# Patient Record
Sex: Male | Born: 1950 | Race: Black or African American | Hispanic: No | Smoking: Never smoker
Health system: Southern US, Community
[De-identification: ages and names within clinical notes are randomized; demographics above are authoritative.]

## PROBLEM LIST (undated history)

## (undated) DIAGNOSIS — I671 Cerebral aneurysm, nonruptured: Secondary | ICD-10-CM

## (undated) DIAGNOSIS — E119 Type 2 diabetes mellitus without complications: Secondary | ICD-10-CM

## (undated) HISTORY — PX: BRAIN SURGERY: SHX531

---

## 2005-06-21 ENCOUNTER — Emergency Department (HOSPITAL_COMMUNITY): Admission: EM | Admit: 2005-06-21 | Discharge: 2005-06-21 | Payer: Self-pay | Admitting: Emergency Medicine

## 2021-03-20 ENCOUNTER — Emergency Department: Payer: Medicare Other

## 2021-03-20 ENCOUNTER — Other Ambulatory Visit: Payer: Self-pay

## 2021-03-20 ENCOUNTER — Emergency Department
Admission: EM | Admit: 2021-03-20 | Discharge: 2021-03-21 | Disposition: A | Discharge status: Home / Self Care | Payer: Medicare Other | Attending: Emergency Medicine | Admitting: Emergency Medicine

## 2021-03-20 DIAGNOSIS — U071 COVID-19: Secondary | ICD-10-CM | POA: Diagnosis not present

## 2021-03-20 DIAGNOSIS — R410 Disorientation, unspecified: Secondary | ICD-10-CM | POA: Diagnosis not present

## 2021-03-20 DIAGNOSIS — E860 Dehydration: Secondary | ICD-10-CM | POA: Insufficient documentation

## 2021-03-20 DIAGNOSIS — E119 Type 2 diabetes mellitus without complications: Secondary | ICD-10-CM | POA: Insufficient documentation

## 2021-03-20 DIAGNOSIS — M79671 Pain in right foot: Secondary | ICD-10-CM | POA: Insufficient documentation

## 2021-03-20 DIAGNOSIS — M79672 Pain in left foot: Secondary | ICD-10-CM | POA: Insufficient documentation

## 2021-03-20 DIAGNOSIS — R42 Dizziness and giddiness: Secondary | ICD-10-CM | POA: Diagnosis present

## 2021-03-20 HISTORY — DX: Type 2 diabetes mellitus without complications: E11.9

## 2021-03-20 HISTORY — DX: Cerebral aneurysm, nonruptured: I67.1

## 2021-03-20 LAB — BASIC METABOLIC PANEL
Anion gap: 7 (ref 5–15)
BUN: 22 mg/dL (ref 8–23)
CO2: 24 mmol/L (ref 22–32)
Calcium: 8.7 mg/dL — ABNORMAL LOW (ref 8.9–10.3)
Chloride: 105 mmol/L (ref 98–111)
Creatinine, Ser: 1.64 mg/dL — ABNORMAL HIGH (ref 0.61–1.24)
GFR, Estimated: 44 mL/min — ABNORMAL LOW (ref >60)
Glucose, Bld: 157 mg/dL — ABNORMAL HIGH (ref 70–99)
Potassium: 3.7 mmol/L (ref 3.5–5.1)
Sodium: 136 mmol/L (ref 135–145)

## 2021-03-20 LAB — CBC
HCT: 51.4 % (ref 39.0–52.0)
Hemoglobin: 17.5 g/dL — ABNORMAL HIGH (ref 13.0–17.0)
MCH: 29.5 pg (ref 26.0–34.0)
MCHC: 34 g/dL (ref 30.0–36.0)
MCV: 86.7 fL (ref 80.0–100.0)
Platelets: 207 10*3/uL (ref 150–400)
RBC: 5.93 MIL/uL — ABNORMAL HIGH (ref 4.22–5.81)
RDW: 13.2 % (ref 11.5–15.5)
WBC: 5.5 10*3/uL (ref 4.0–10.5)
nRBC: 0 % (ref 0.0–0.2)

## 2021-03-20 LAB — HEPATIC FUNCTION PANEL
ALT: 21 U/L (ref 0–44)
AST: 28 U/L (ref 15–41)
Albumin: 3.6 g/dL (ref 3.5–5.0)
Alkaline Phosphatase: 52 U/L (ref 38–126)
Bilirubin, Direct: 0.2 mg/dL (ref 0.0–0.2)
Indirect Bilirubin: 0.7 mg/dL (ref 0.3–0.9)
Total Bilirubin: 0.9 mg/dL (ref 0.3–1.2)
Total Protein: 6.6 g/dL (ref 6.5–8.1)

## 2021-03-20 LAB — LIPASE, BLOOD: Lipase: 45 U/L (ref 11–51)

## 2021-03-20 LAB — URINALYSIS, COMPLETE (UACMP) WITH MICROSCOPIC
Bacteria, UA: NONE SEEN
Bilirubin Urine: NEGATIVE
Glucose, UA: NEGATIVE mg/dL
Hgb urine dipstick: NEGATIVE
Ketones, ur: 20 mg/dL — AB
Leukocytes,Ua: NEGATIVE
Nitrite: NEGATIVE
Protein, ur: 100 mg/dL — AB
Specific Gravity, Urine: 1.03 (ref 1.005–1.030)
pH: 5 (ref 5.0–8.0)

## 2021-03-20 LAB — RESP PANEL BY RT-PCR (FLU A&B, COVID) ARPGX2
Influenza A by PCR: NEGATIVE
Influenza B by PCR: NEGATIVE
SARS Coronavirus 2 by RT PCR: POSITIVE — AB

## 2021-03-20 MED ORDER — SODIUM CHLORIDE 0.9 % IV BOLUS
1000.0000 mL | Freq: Once | INTRAVENOUS | Status: AC
  Administered 2021-03-20: 1000 mL via INTRAVENOUS

## 2021-03-20 MED ORDER — PAXLOVID 20 X 150 MG & 10 X 100MG PO TBPK: 3.0000 | ORAL_TABLET | Freq: Two times a day (BID) | ORAL | 0 refills | Status: AC

## 2021-03-20 MED ORDER — FAMOTIDINE IN NACL 20-0.9 MG/50ML-% IV SOLN
20.0000 mg | Freq: Once | INTRAVENOUS | Status: AC
  Administered 2021-03-20: 20 mg via INTRAVENOUS
  Filled 2021-03-20: qty 50

## 2021-03-20 MED ORDER — METOCLOPRAMIDE HCL 5 MG/ML IJ SOLN
10.0000 mg | Freq: Once | INTRAMUSCULAR | Status: AC
  Administered 2021-03-20: 10 mg via INTRAVENOUS
  Filled 2021-03-20: qty 2

## 2021-03-20 MED ORDER — ONDANSETRON 4 MG PO TBDP: 4.0000 mg | ORAL_TABLET | Freq: Three times a day (TID) | ORAL | 0 refills | Status: DC | PRN

## 2021-03-20 MED ORDER — ACETAMINOPHEN 325 MG PO TABS: 650.0000 mg | ORAL_TABLET | Freq: Four times a day (QID) | ORAL | 0 refills | Status: DC | PRN

## 2021-03-20 NOTE — ED Notes (Signed)
X-ray at bedside

## 2021-03-20 NOTE — ED Provider Notes (Signed)
[facility] Emergency Department Provider Note  ____________________________________________  Time seen: Approximately 11:50 PM  I have reviewed the triage vital signs and the nursing notes.   HISTORY  Chief Complaint Dizziness    Level 5 Caveat: Portions of the History and Physical including HPI and review of systems are unable to be completely obtained due to patient being a poor historian   HPI Glenn Barnett is a 70 y.o. male with a past history of diabetes who was brought to the ED due to dizziness and confusion.  He complains of chronic bilateral foot pain from diabetic neuropathy.  Reports indigestion with belching and poor oral intake.    Past Medical History:  Diagnosis Date   Brain aneurysm    Diabetes mellitus without complication (HCC)      There are no problems to display for this patient.    History reviewed. No pertinent surgical history.   Prior to Admission medications   Medication Sig Start Date End Date Taking? Authorizing Provider  acetaminophen (TYLENOL) 325 MG tablet Take 2 tablets (650 mg total) by mouth every 6 (six) hours as needed. 03/20/21  Yes Sharman Cheek, MD  Nirmatrelvir & Ritonavir (PAXLOVID) 20 x 150 MG & 10 x 100MG  TBPK Take 3 tablets by mouth in the morning and at bedtime for 5 days. Take according to package instruction 03/20/21 03/25/21 Yes 03/27/21, MD  ondansetron (ZOFRAN ODT) 4 MG disintegrating tablet Take 1 tablet (4 mg total) by mouth every 8 (eight) hours as needed for nausea or vomiting. 03/20/21  Yes 05/20/21, MD     Allergies Patient has no known allergies.   No family history on file.  Social History Social History   Tobacco Use   Smoking status: Never   Smokeless tobacco: Never  Substance Use Topics   Alcohol use: Not Currently   Drug use: Not Currently    Review of Systems Level 5 Caveat: Portions of the History and Physical including HPI and review of systems are  unable to be completely obtained due to patient being a poor historian   Constitutional:   No known fever.  ENT:   No rhinorrhea. Cardiovascular:   No chest pain or syncope. Respiratory:   No dyspnea or cough. Gastrointestinal:   Negative for abdominal pain, vomiting and diarrhea.  Musculoskeletal:   Negative for focal pain or swelling ____________________________________________   PHYSICAL EXAM:  VITAL SIGNS: ED Triage Vitals  Enc Vitals Group     BP 03/20/21 2108 (!) 161/86     Pulse Rate 03/20/21 2108 91     Resp 03/20/21 2108 18     Temp 03/20/21 2108 99.1 F (37.3 C)     Temp Source 03/20/21 2108 Oral     SpO2 03/20/21 2108 98 %     Weight 03/20/21 2109 250 lb (113.4 kg)     Height 03/20/21 2109 6\' 4"  (1.93 m)     Head Circumference --      Peak Flow --      Pain Score 03/20/21 2109 8     Pain Loc --      Pain Edu? --      Excl. in GC? --     Vital signs reviewed, nursing assessments reviewed.   Constitutional:   Alert and oriented to person and place.. Non-toxic appearance. Eyes:   Conjunctivae are normal. EOMI. PERRL. ENT      Head:   Normocephalic and atraumatic.      Nose:  No congestion/rhinnorhea.       Mouth/Throat:   Dry mucous membranes, no pharyngeal erythema. No peritonsillar mass.       Neck:   No meningismus. Full ROM. Hematological/Lymphatic/Immunilogical:   No cervical lymphadenopathy. Cardiovascular:   RRR. Symmetric bilateral radial and DP pulses.  No murmurs. Cap refill less than 2 seconds. Respiratory:   Normal respiratory effort without tachypnea/retractions. Breath sounds are clear and equal bilaterally. No wheezes/rales/rhonchi. Gastrointestinal:   Soft and nontender. Non distended. There is no CVA tenderness.  No rebound, rigidity, or guarding. Genitourinary:   deferred Musculoskeletal:   Normal range of motion in all extremities. No joint effusions.  No lower extremity tenderness.  No edema. Neurologic:   Normal speech and language.   Poor memory recall Motor grossly intact. No acute focal neurologic deficits are appreciated.  Skin:    Skin is warm, dry and intact. No rash noted.  No petechiae, purpura, or bullae.  ____________________________________________    LABS (pertinent positives/negatives) (all labs ordered are listed, but only abnormal results are displayed) Labs Reviewed  RESP PANEL BY RT-PCR (FLU A&B, COVID) ARPGX2 - Abnormal; Notable for the following components:      Result Value   SARS Coronavirus 2 by RT PCR POSITIVE (*)    All other components within normal limits  BASIC METABOLIC PANEL - Abnormal; Notable for the following components:   Glucose, Bld 157 (*)    Creatinine, Ser 1.64 (*)    Calcium 8.7 (*)    GFR, Estimated 44 (*)    All other components within normal limits  CBC - Abnormal; Notable for the following components:   RBC 5.93 (*)    Hemoglobin 17.5 (*)    All other components within normal limits  URINALYSIS, COMPLETE (UACMP) WITH MICROSCOPIC - Abnormal; Notable for the following components:   Color, Urine AMBER (*)    APPearance HAZY (*)    Ketones, ur 20 (*)    Protein, ur 100 (*)    All other components within normal limits  LIPASE, BLOOD  HEPATIC FUNCTION PANEL   ____________________________________________   EKG  Interpreted by me  Date: 03/20/2021  Rate: 89  Rhythm: normal sinus rhythm  QRS Axis: normal  Intervals: normal  ST/T Wave abnormalities: normal  Conduction Disutrbances: none  Narrative Interpretation: unremarkable     ____________________________________________    RADIOLOGY  CT Head Wo Contrast  Result Date: 03/20/2021 CLINICAL DATA:  Mental status changes EXAM: CT HEAD WITHOUT CONTRAST TECHNIQUE: Contiguous axial images were obtained from the base of the skull through the vertex without intravenous contrast. COMPARISON:  None. 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 Mild age related volume loss. No acute intracranial  abnormality. Specifically, no hemorrhage, hydrocephalus, mass lesion, acute infarction, or significant intracranial injury. Vascular: No hyperdense vessel or unexpected calcification. Aneurysm clips in the region of the anterior communicating artery. Skull: Prior right frontotemporal craniotomy. Sinuses/Orbits: No acute findings Other: None 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 in the region of the anterior communicating artery. No acute intracranial abnormality. Electronically Signed   By: Charlett Nose M.D.   On: 03/20/2021 23:27   DG Chest Portable 1 View  Result Date: 03/20/2021 CLINICAL DATA:  Weakness, upper abdominal pain EXAM: PORTABLE CHEST 1 VIEW COMPARISON:  06/21/2005 FINDINGS: The heart size and mediastinal contours are within normal limits. Both lungs are clear. The visualized skeletal structures are unremarkable. IMPRESSION: No active disease. Electronically Signed   By: Charlett Nose M.D.  On: 03/20/2021 22:35    ____________________________________________   PROCEDURES Procedures  ____________________________________________  DIFFERENTIAL DIAGNOSIS   Intracranial hemorrhage, brain tumor, viral illness, electrolyte abnormality, dehydration, pneumonia, UTI, dementia, GERD  CLINICAL IMPRESSION / ASSESSMENT AND PLAN / ED COURSE  Medications ordered in the ED: Medications  sodium chloride 0.9 % bolus 1,000 mL (1,000 mLs Intravenous New Bag/Given 03/20/21 2229)  metoCLOPramide (REGLAN) injection 10 mg (10 mg Intravenous Given 03/20/21 2229)  famotidine (PEPCID) IVPB 20 mg premix (0 mg Intravenous Stopped 03/20/21 2313)    Pertinent labs & imaging results that were available during my care of the patient were reviewed by me and considered in my medical decision making (see chart for details).   Abron Neddo was evaluated in Emergency Department on 03/20/2021 for the symptoms described in the history of present illness. He was evaluated in  the context of the global COVID-19 pandemic, which necessitated consideration that the patient might be at risk for infection with the SARS-CoV-2 virus that causes COVID-19. Institutional protocols and algorithms that pertain to the evaluation of patients at risk for COVID-19 are in a state of rapid change based on information released by regulatory bodies including the CDC and federal and state organizations. These policies and algorithms were followed during the patient's care in the ED.   Patient brought to ED with indigestion and dizziness, appears dehydrated.  Vital signs are unremarkable, not in distress.  Given IV fluids and antacids, feeling okay.  Flu negative, COVID positive.  We will again prescribe Paxlovid.  Stable for discharge in care of daughter.      ____________________________________________   FINAL CLINICAL IMPRESSION(S) / ED DIAGNOSES    Final diagnoses:  Dehydration  COVID-19 virus infection     ED Discharge Orders          Ordered    Nirmatrelvir & Ritonavir (PAXLOVID) 20 x 150 MG & 10 x 100MG  TBPK  2 times daily        03/20/21 2347    acetaminophen (TYLENOL) 325 MG tablet  Every 6 hours PRN        03/20/21 2347    ondansetron (ZOFRAN ODT) 4 MG disintegrating tablet  Every 8 hours PRN        03/20/21 2347            Portions of this note were generated with dragon dictation software. Dictation errors may occur despite best attempts at proofreading.   05/20/21, MD 03/20/21 [phone]

## 2021-03-20 NOTE — Discharge Instructions (Addendum)
Your labs and CT scan are all normal.  Your COVID test is positive which may be causing some of the symptoms, and you appear to be somewhat dehydrated.  Take Tylenol as needed for pain and fever.  Zofran for nausea.  Paxlovid to block the virus in your body to speed the resolution of your symptoms.
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
## 2021-03-20 NOTE — ED Notes (Signed)
Patient transported to CT 

## 2021-03-20 NOTE — ED Notes (Signed)
Patient spitting up and burping frequently. Patient provided with emesis bag.

## 2021-03-20 NOTE — ED Notes (Signed)
ED Provider at bedside. 
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
## 2021-03-21 NOTE — ED Notes (Signed)
Ginger ale and crackers provided, no other needs at this time

## 2021-03-21 NOTE — ED Notes (Signed)
Pt laying on side in bed with easy unlabored respirations

## 2021-03-21 NOTE — ED Notes (Addendum)
Pt updated by provider that he will stay overnight for SW to evaluate in the morning. Pt given hospital bed, blankets, additional pillows, tv remote, lights turned off. declines offer of food or other needs at this time. Family also updated

## 2021-03-21 NOTE — ED Notes (Signed)
Spoke with pts daughter, [place], who requested update on patients status. RN provided that pt would board in ED until SW came in, in AM to contact and assess pts need for placement or other medical needs for safe discharge. Per daughter pt does not have medical POA or legal guardian. Daughter to seek medical history from previous PCP/medical offices with patient history to have faxed to current facility [facility] ED.)

## 2021-03-21 NOTE — ED Notes (Signed)
Pt calm , collective 

## 2021-03-21 NOTE — ED Notes (Signed)
Pt walking in room , calm , collective

## 2021-03-21 NOTE — ED Notes (Addendum)
Discharge instructions reviewed with pt. Pt calm , collective , denied pain or sob. Pt ambulatory upon discharge, Pt discharged with son in law

## 2021-03-21 NOTE — ED Notes (Signed)
Gave MD. Cyril Loosen pt daughter information.

## 2021-03-21 NOTE — TOC Transition Note (Signed)
Transition of Care [facility]) - CM/SW Discharge Note   Patient Details  Name: Glenn Barnett MRN: [id number] Date of Birth: Apr 18, 1951  [facility]) CM/SW Contact:  Marina Goodell Phone Number: [phone] 03/21/2021, 9:09 AM   Clinical Narrative:     Patient will transport to motel.  Patient has safe discharge and insurance.  CSW checked w/ EDP and patient does not have other needs.         Patient Goals and CMS Choice        Discharge Placement                       Discharge Plan and Services                                     Social Determinants of Health (SDOH) Interventions     Readmission Risk Interventions No flowsheet data found.

## 2021-03-21 NOTE — TOC Progression Note (Signed)
Transition of Care [facility]) - Progression Note    Patient Details  Name: Glenn Barnett MRN: [id number] Date of Birth: 1951-03-15  Transition of Care [facility]) CM/SW Contact  Marina Goodell Phone Number: [phone] 03/21/2021, 11:10 AM  Clinical Narrative:     CSW spoke with patient's daughter Glenn Barnett [phone] [facility]) wit update on discharge plan.  Ms. Manson Passey expressed concern about the patient being on his own stating the patient has a hx of dementia.  CSW explained to Ms. Manson Passey there is not a dx of dementia or alzheimer and the patient displays capacity to make his own decisions.  Ms. Manson Passey stated she is unable to have the patient stay with her at home due to his COVID+ diagnosis.  Ms. Manson Passey stated the only option would be to take the patient back to the hotel but she felt the patient was unsafe to         Expected Discharge Plan and Services                                                 Social Determinants of Health (SDOH) Interventions    Readmission Risk Interventions No flowsheet data found.

## 2021-03-21 NOTE — ED Notes (Signed)
Ice water tolerated
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
## 2021-03-21 NOTE — ED Notes (Signed)
Pt reports night light on bed is bothering him 'its like a flashlight,' unable to turn off on bed controls so wrapped in linens and pt appreciative. No other needs at this time

## 2021-03-21 NOTE — ED Notes (Signed)
Attempted to call daughter , Left message

## 2021-04-13 ENCOUNTER — Other Ambulatory Visit: Payer: Self-pay

## 2021-04-13 ENCOUNTER — Ambulatory Visit (HOSPITAL_COMMUNITY)
Admission: EM | Admit: 2021-04-13 | Discharge: 2021-04-13 | Disposition: A | Discharge status: Home / Self Care | Payer: Medicare Other | Attending: Medical Oncology | Admitting: Medical Oncology

## 2021-04-13 ENCOUNTER — Encounter (HOSPITAL_COMMUNITY): Payer: Self-pay | Admitting: *Deleted

## 2021-04-13 DIAGNOSIS — Z91138 Patient's unintentional underdosing of medication regimen for other reason: Secondary | ICD-10-CM | POA: Diagnosis not present

## 2021-04-13 DIAGNOSIS — R42 Dizziness and giddiness: Secondary | ICD-10-CM

## 2021-04-13 LAB — CBG MONITORING, ED: Glucose-Capillary: 147 mg/dL — ABNORMAL HIGH (ref 70–99)

## 2021-04-13 MED ORDER — METFORMIN HCL 500 MG PO TABS: 500.0000 mg | ORAL_TABLET | Freq: Two times a day (BID) | ORAL | 0 refills | Status: DC

## 2021-04-13 MED ORDER — GABAPENTIN 100 MG PO CAPS: 100.0000 mg | ORAL_CAPSULE | Freq: Three times a day (TID) | ORAL | 0 refills | Status: DC

## 2021-04-13 MED ORDER — TAMSULOSIN HCL 0.4 MG PO CAPS: 0.4000 mg | ORAL_CAPSULE | Freq: Every day | ORAL | 0 refills | Status: DC

## 2021-04-13 NOTE — ED Provider Notes (Addendum)
[facility]    CSN: [id number] Arrival date & time: 04/13/21  1315      History   Chief Complaint Chief Complaint  Patient presents with   Dizziness   Fever    nau   Nausea   LT shoulder    HPI Glenn Barnett is a 70 y.o. male.   HPI  Dizziness: Patient is a slightly poor historian.  Patient states that about 2 to 3 days ago he noticed some dizziness primarily when he gets out of bed too quickly.  He has not noticed any other significant positional triggers for the dizziness.  He states that the dizziness feels like the room is spinning slightly and resolves quickly on its own.  He also has noticed some recurrence of his diabetic neuropathy.  Some mild associated nausea without vomiting.  No head injury.  He reports that he also has been more tired than normal with some generalized body aches.  He reports that he has recently been transitioning from [facility] to here and that he has run out of his medications about 2 to 3 days ago right before symptoms started.  He is unsure exactly his doses of medication he was taking but he knew that he was taking metformin 500 mg at least 2 times per day, Flomax and gabapentin.  He thinks that he may have been on 3600 mg of gabapentin per day and thinks that he stopped this medication between 2 days and 4 weeks ago.  He denies any shortness of breath, chest pain, calf pain, visual changes, double vision, headache, cold symptoms, cough.   Past Medical History:  Diagnosis Date   Brain aneurysm    Diabetes mellitus without complication (HCC)     There are no problems to display for this patient.   History reviewed. No pertinent surgical history.     Home Medications    Prior to Admission medications   Medication Sig Start Date End Date Taking? Authorizing Provider  acetaminophen (TYLENOL) 325 MG tablet Take 2 tablets (650 mg total) by mouth every 6 (six) hours as needed. 03/20/21   Sharman Cheek, MD  ondansetron (ZOFRAN  ODT) 4 MG disintegrating tablet Take 1 tablet (4 mg total) by mouth every 8 (eight) hours as needed for nausea or vomiting. 03/20/21   Sharman Cheek, MD    Family History History reviewed. No pertinent family history.  Social History Social History   Tobacco Use   Smoking status: Never   Smokeless tobacco: Never  Substance Use Topics   Alcohol use: Not Currently   Drug use: Not Currently     Allergies   Other   Review of Systems Review of Systems  As stated above in HPI Physical Exam Triage Vital Signs ED Triage Vitals [04/13/21 1329]  Enc Vitals Group     BP (!) 147/81     Pulse Rate 81     Resp 20     Temp 98.7 F (37.1 C)     Temp Source Oral     SpO2 94 %     Weight      Height      Head Circumference      Peak Flow      Pain Score      Pain Loc      Pain Edu?      Excl. in GC?    No data found.  Updated Vital Signs BP (!) 147/81   Pulse 81   Temp 98.7  F (37.1 C) (Oral)   Resp 20   SpO2 94%   Physical Exam Vitals and nursing note reviewed.  Constitutional:      General: He is not in acute distress.    Appearance: Normal appearance. He is not ill-appearing, toxic-appearing or diaphoretic.  HENT:     Head: Normocephalic and atraumatic.     Nose: Nose normal.     Mouth/Throat:     Mouth: Mucous membranes are moist.  Eyes:     Extraocular Movements: Extraocular movements intact.     Pupils: Pupils are equal, round, and reactive to light.     Comments: Mild nystagmus of the left eye to EOM exercises  Cardiovascular:     Rate and Rhythm: Normal rate and regular rhythm.     Pulses: Normal pulses.     Heart sounds: Normal heart sounds.  Pulmonary:     Effort: Pulmonary effort is normal.     Breath sounds: Normal breath sounds.  Musculoskeletal:     Cervical back: Normal range of motion and neck supple. No rigidity or tenderness.  Lymphadenopathy:     Cervical: No cervical adenopathy.  Skin:    General: Skin is warm.     Coloration:  Skin is not jaundiced or pale.     Findings: No erythema.  Neurological:     General: No focal deficit present.     Mental Status: He is alert and oriented to person, place, and time.     Cranial Nerves: No cranial nerve deficit.     Sensory: No sensory deficit.     Motor: No weakness.     Coordination: Coordination normal.     Gait: Gait normal.     Deep Tendon Reflexes: Reflexes normal.  Psychiatric:        Mood and Affect: Mood normal.        Behavior: Behavior normal.        Thought Content: Thought content normal.        Judgment: Judgment normal.     UC Treatments / Results  Labs (all labs ordered are listed, but only abnormal results are displayed) Labs Reviewed  CBG MONITORING, ED - Abnormal; Notable for the following components:      Result Value   Glucose-Capillary 147 (*)    All other components within normal limits    EKG   Radiology No results found.  Procedures Procedures (including critical care time)  Medications Ordered in UC Medications - No data to display  Initial Impression / Assessment and Plan / UC Course  I have reviewed the triage vital signs and the nursing notes.  Pertinent labs & imaging results that were available during my care of the patient were reviewed by me and considered in my medical decision making (see chart for details).     New.  Patient does not appear disoriented or confused.  Neurologically he appears intact and has no significant findings suggestive of a stroke versus complication of his aneurysm.  We discussed that he may be having some positional vertigo however given the significant abrupt stopping his medications he more than likely is having secondary side effects especially to stopping the gabapentin.  He feels more strongly that he stopped this medication a few weeks ago rather than 2 days ago.  For this reason we are going to start him off on a lower dose of gabapentin as we are not confident in the dose he was  taking.  We are also going to restart  him on his metformin and tamsulosin.  He should be feeling better over the next few days.  In the meantime we discussed fall precautions along with red flag signs and symptoms that would warrant going to the emergency room or having additional follow-up with our office.  He will try to get established with a primary care provider.    Final Clinical Impressions(s) / UC Diagnoses   Final diagnoses:  None   Discharge Instructions   None    ED Prescriptions   None    PDMP not reviewed this encounter.   Rushie Chestnut, PA-C 04/13/21 1525    [address], PA-C 04/13/21 1527

## 2021-04-13 NOTE — ED Triage Notes (Signed)
Pt reports he has been traveling  a lot and yesterday he he started to feel dizzy and had a fever with nausea.

## 2021-12-29 ENCOUNTER — Emergency Department
Admission: EM | Admit: 2021-12-29 | Discharge: 2021-12-29 | Disposition: A | Discharge status: Home / Self Care | Payer: Medicare Other | Attending: Emergency Medicine | Admitting: Emergency Medicine

## 2021-12-29 ENCOUNTER — Other Ambulatory Visit: Payer: Self-pay

## 2021-12-29 ENCOUNTER — Emergency Department: Payer: Medicare Other

## 2021-12-29 DIAGNOSIS — Z7984 Long term (current) use of oral hypoglycemic drugs: Secondary | ICD-10-CM | POA: Insufficient documentation

## 2021-12-29 DIAGNOSIS — R42 Dizziness and giddiness: Secondary | ICD-10-CM | POA: Diagnosis present

## 2021-12-29 DIAGNOSIS — E119 Type 2 diabetes mellitus without complications: Secondary | ICD-10-CM | POA: Insufficient documentation

## 2021-12-29 DIAGNOSIS — I10 Essential (primary) hypertension: Secondary | ICD-10-CM | POA: Diagnosis not present

## 2021-12-29 DIAGNOSIS — R2681 Unsteadiness on feet: Secondary | ICD-10-CM | POA: Diagnosis not present

## 2021-12-29 DIAGNOSIS — Z79899 Other long term (current) drug therapy: Secondary | ICD-10-CM | POA: Insufficient documentation

## 2021-12-29 LAB — BASIC METABOLIC PANEL
Anion gap: 10 (ref 5–15)
BUN: 24 mg/dL — ABNORMAL HIGH (ref 8–23)
CO2: 25 mmol/L (ref 22–32)
Calcium: 9 mg/dL (ref 8.9–10.3)
Chloride: 102 mmol/L (ref 98–111)
Creatinine, Ser: 1.95 mg/dL — ABNORMAL HIGH (ref 0.61–1.24)
GFR, Estimated: 36 mL/min — ABNORMAL LOW (ref >60)
Glucose, Bld: 134 mg/dL — ABNORMAL HIGH (ref 70–99)
Potassium: 3.7 mmol/L (ref 3.5–5.1)
Sodium: 137 mmol/L (ref 135–145)

## 2021-12-29 LAB — CBC
HCT: 55.7 % — ABNORMAL HIGH (ref 39.0–52.0)
Hemoglobin: 18 g/dL — ABNORMAL HIGH (ref 13.0–17.0)
MCH: 28.2 pg (ref 26.0–34.0)
MCHC: 32.3 g/dL (ref 30.0–36.0)
MCV: 87.3 fL (ref 80.0–100.0)
Platelets: 349 10*3/uL (ref 150–400)
RBC: 6.38 MIL/uL — ABNORMAL HIGH (ref 4.22–5.81)
RDW: 13.4 % (ref 11.5–15.5)
WBC: 10.2 10*3/uL (ref 4.0–10.5)
nRBC: 0 % (ref 0.0–0.2)

## 2021-12-29 LAB — TROPONIN I (HIGH SENSITIVITY): Troponin I (High Sensitivity): 7 ng/L (ref <18)

## 2021-12-29 MED ORDER — LACTATED RINGERS IV BOLUS
1000.0000 mL | Freq: Once | INTRAVENOUS | Status: AC
  Administered 2021-12-29: 1000 mL via INTRAVENOUS

## 2021-12-29 NOTE — ED Notes (Signed)
First nurse and CN will talk.  ?

## 2021-12-29 NOTE — ED Notes (Signed)
Taxi voucher obtained for discharge. Pt stating has no money to call cab for ride. ?

## 2021-12-29 NOTE — ED Notes (Signed)
Pt to ED for intermittent dizziness since 1 month. Pt was orthostatic in triage.  ?

## 2021-12-29 NOTE — ED Notes (Signed)
Pt was calling out because was cold. Provided blankets and helped pt reposition in bed. Pt complaining that bed not comfortable. ?

## 2021-12-29 NOTE — ED Notes (Signed)
ED signature pad does not work. Pt being discharged to lobby. ?

## 2021-12-29 NOTE — ED Notes (Signed)
No answer times 2 for [facility] and VM full. Tried to call daughter listed in chart. No answer, mailbox full. Called CN, who said we can have courtesy ride bring to bus stop in parking lot.  ?

## 2021-12-29 NOTE — ED Notes (Signed)
Pt on the call bell saying he needs to use the bathroom and saying he needs to get the "Fu** hooked back up" yelling and cursing with a loud voice. ?

## 2021-12-29 NOTE — ED Notes (Signed)
Pt will discharge to lobby. ?

## 2021-12-29 NOTE — ED Triage Notes (Signed)
Pt here with dizziness via [facility]. Pt woke up with the dizziness today, this has happened in the past, cause unknown. Hx of DM and htn. Pt stable on arrival to ED. ? ? ?117/87-lying ?85/52-sitting ?68/45-standing ?127-cbg ?76 ?97% RA ?20G RAC ?

## 2021-12-29 NOTE — Discharge Instructions (Signed)
Your lab work today showed that you are likely dehydrated.  You should drink at least 1 L of water daily to help prevent additional dizzy episodes.  Your blood pressure was also borderline low and for now you should stop your blood pressure medicine.  Please do not take tamsulosin as this could also contribute to your dizziness.  You need to establish care with a local PCP and have been provided a phone number to call to do so.  Please also follow-up with cardiology for Holter monitor placement.  You should return to the ER for any new or worsening symptoms. ?

## 2021-12-29 NOTE — ED Provider Notes (Signed)
? ?[facility] ?Provider Note ? ? ? Event Date/Time  ? First MD Initiated Contact with Patient 12/29/21 1536   ?  (approximate) ? ? ?History  ? ?Chief Complaint ?Dizziness ? ? ?HPI ? ?Glenn Barnett is a 71 y.o. male with past medical history of hypertension and diabetes who presents to the ED for dizziness.  Patient reports that he has been having intermittent episodes of dizziness and lightheadedness for over the past month.  He had return of symptoms around 8 AM this morning when he went to get up out of bed.  He states that the unsteadiness is present at rest but gets worse when he goes to stand up.  He has never passed out and denies any associated chest pain or shortness of breath.  He did go to the walk-in clinic at [place] 9 days ago, noted to be hypertensive at that time and restarted on lisinopril and hydrochlorothiazide as well as metformin.  He has continued to have intermittent episodes of dizziness and lightheadedness since then.  He denies any vision changes, speech changes, numbness, weakness. ? ?  ? ? ?Physical Exam  ? ?Triage Vital Signs: ?ED Triage Vitals  ?Enc Vitals Group  ?   BP 12/29/21 1437 95/62  ?   Pulse Rate 12/29/21 1437 (!) 102  ?   Resp 12/29/21 1437 20  ?   Temp 12/29/21 1437 98.5 ?F (36.9 ?C)  ?   Temp Source 12/29/21 1437 Oral  ?   SpO2 12/29/21 1437 99 %  ?   Weight 12/29/21 1435 250 lb (113.4 kg)  ?   Height 12/29/21 1435 6\' 4"  (1.93 m)  ?   Head Circumference --   ?   Peak Flow --   ?   Pain Score 12/29/21 1435 0  ?   Pain Loc --   ?   Pain Edu? --   ?   Excl. in GC? --   ? ? ?Most recent vital signs: ?Vitals:  ? 12/29/21 1650 12/29/21 1700  ?BP:  103/63  ?Pulse: 92 95  ?Resp: 17 18  ?Temp:    ?SpO2: 100% 100%  ? ? ?Constitutional: Alert and oriented. ?Eyes: Conjunctivae are normal. ?Head: Atraumatic. ?Nose: No congestion/rhinnorhea. ?Mouth/Throat: Mucous membranes are moist.  ?Cardiovascular: Normal rate, regular rhythm. Grossly normal heart sounds.  2+  radial pulses bilaterally. ?Respiratory: Normal respiratory effort.  No retractions. Lungs CTAB. ?Gastrointestinal: Soft and nontender. No distention. ?Musculoskeletal: No lower extremity tenderness nor edema.  ?Neurologic:  Normal speech and language. No gross focal neurologic deficits are appreciated. ? ? ? ?ED Results / Procedures / Treatments  ? ?Labs ?(all labs ordered are listed, but only abnormal results are displayed) ?Labs Reviewed  ?BASIC METABOLIC PANEL - Abnormal; Notable for the following components:  ?    Result Value  ? Glucose, Bld 134 (*)   ? BUN 24 (*)   ? Creatinine, Ser 1.95 (*)   ? GFR, Estimated 36 (*)   ? All other components within normal limits  ?CBC - Abnormal; Notable for the following components:  ? RBC 6.38 (*)   ? Hemoglobin 18.0 (*)   ? HCT 55.7 (*)   ? All other components within normal limits  ?URINALYSIS, ROUTINE W REFLEX MICROSCOPIC  ?CBG MONITORING, ED  ?TROPONIN I (HIGH SENSITIVITY)  ? ? ? ?EKG ? ?ED ECG REPORT ?12/31/21, the attending physician, personally viewed and interpreted this ECG. ? ? Date: 12/29/2021 ? EKG Time: 14:40 ?  Rate: 106 ? Rhythm: sinus tachycardia ? Axis: Normal ? Intervals:none ? ST&T Change: None ? ?RADIOLOGY ?CT head reviewed by me with no hemorrhage or midline shift. ? ?PROCEDURES: ? ?Critical Care performed: No ? ?.1-3 Lead EKG Interpretation ?Performed by: Chesley Noon, MD ?Authorized by: Chesley Noon, MD  ? ?  Interpretation: normal   ?  ECG rate:  80-100 ?  ECG rate assessment: normal   ?  Rhythm: sinus rhythm   ?  Ectopy: none   ?  Conduction: normal   ? ? ?MEDICATIONS ORDERED IN ED: ?Medications  ?lactated ringers bolus 1,000 mL (1,000 mLs Intravenous New Bag/Given 12/29/21 1556)  ? ? ? ?IMPRESSION / MDM / ASSESSMENT AND PLAN / ED COURSE  ?I reviewed the triage vital signs and the nursing notes. ?             ?               ? ?71 y.o. male with past medical history of hypertension and diabetes who presents to the ED for intermittent  episodes of dizziness and lightheadedness, worse when going to stand up. ? ?Differential diagnosis includes, but is not limited to, stroke, electrolyte abnormality, dehydration, orthostatic hypotension, medication effect, arrhythmia, ACS. ? ?Patient nontoxic-appearing and in no acute distress, vital signs reassuring although patient noted to be significantly orthostatic in triage.  EKG shows no evidence of arrhythmia or ischemia and I have low suspicion for cardiac etiology but we will add on troponin.  Labs thus far remarkable for mild AKI along with elevated hemoglobin consistent with hemoconcentration.  We will hydrate with IV fluids and observe on cardiac monitor.  Patient may also benefit from stroke rule out given significant persistent symptoms and CT head is pending at this time. ? ?The patient is on the cardiac monitor to evaluate for evidence of arrhythmia and/or significant heart rate changes. ? ?CT head is negative for acute process, no events noted on cardiac monitor.  Troponin is within normal limits and patient reports feeling slightly better following IV fluid bolus.  He was able to stand and walk with mild ongoing dizziness, blood pressure does remain borderline low and I am concerned the restarting of his blood pressure medicines could be contributing to symptoms.  He was counseled to stop these and drink plenty of fluids, follow-up with cardiology and establish care with PCP.  He was counseled to return to the ED for new worsening symptoms, patient agrees with plan. ? ?  ? ? ?FINAL CLINICAL IMPRESSION(S) / ED DIAGNOSES  ? ?Final diagnoses:  ?Lightheadedness  ? ? ? ?Rx / DC Orders  ? ?ED Discharge Orders   ? ? None  ? ?  ? ? ? ?Note:  This document was prepared using Dragon voice recognition software and may include unintentional dictation errors. ?  ?Chesley Noon, MD ?12/29/21 1802 ? ?

## 2022-04-28 ENCOUNTER — Emergency Department
Admission: EM | Admit: 2022-04-28 | Discharge: 2022-04-28 | Disposition: A | Discharge status: Home / Self Care | Payer: Medicare Other | Attending: Student in an Organized Health Care Education/Training Program | Admitting: Student in an Organized Health Care Education/Training Program

## 2022-04-28 ENCOUNTER — Emergency Department: Payer: Medicare Other

## 2022-04-28 ENCOUNTER — Other Ambulatory Visit: Payer: Self-pay

## 2022-04-28 DIAGNOSIS — W19XXXA Unspecified fall, initial encounter: Secondary | ICD-10-CM

## 2022-04-28 DIAGNOSIS — W06XXXA Fall from bed, initial encounter: Secondary | ICD-10-CM | POA: Diagnosis not present

## 2022-04-28 DIAGNOSIS — Z23 Encounter for immunization: Secondary | ICD-10-CM | POA: Diagnosis not present

## 2022-04-28 DIAGNOSIS — S0990XA Unspecified injury of head, initial encounter: Secondary | ICD-10-CM | POA: Insufficient documentation

## 2022-04-28 DIAGNOSIS — E860 Dehydration: Secondary | ICD-10-CM

## 2022-04-28 DIAGNOSIS — Z79899 Other long term (current) drug therapy: Secondary | ICD-10-CM | POA: Diagnosis not present

## 2022-04-28 LAB — BASIC METABOLIC PANEL
Anion gap: 8 (ref 5–15)
BUN: 21 mg/dL (ref 8–23)
CO2: 24 mmol/L (ref 22–32)
Calcium: 9.4 mg/dL (ref 8.9–10.3)
Chloride: 103 mmol/L (ref 98–111)
Creatinine, Ser: 1.69 mg/dL — ABNORMAL HIGH (ref 0.61–1.24)
GFR, Estimated: 43 mL/min — ABNORMAL LOW (ref >60)
Glucose, Bld: 135 mg/dL — ABNORMAL HIGH (ref 70–99)
Potassium: 3.7 mmol/L (ref 3.5–5.1)
Sodium: 135 mmol/L (ref 135–145)

## 2022-04-28 LAB — CBC
HCT: 51.5 % (ref 39.0–52.0)
Hemoglobin: 16.7 g/dL (ref 13.0–17.0)
MCH: 28.6 pg (ref 26.0–34.0)
MCHC: 32.4 g/dL (ref 30.0–36.0)
MCV: 88.3 fL (ref 80.0–100.0)
Platelets: 341 10*3/uL (ref 150–400)
RBC: 5.83 MIL/uL — ABNORMAL HIGH (ref 4.22–5.81)
RDW: 13.9 % (ref 11.5–15.5)
WBC: 8.9 10*3/uL (ref 4.0–10.5)
nRBC: 0 % (ref 0.0–0.2)

## 2022-04-28 LAB — URINALYSIS, ROUTINE W REFLEX MICROSCOPIC
Bilirubin Urine: NEGATIVE
Glucose, UA: NEGATIVE mg/dL
Hgb urine dipstick: NEGATIVE
Ketones, ur: NEGATIVE mg/dL
Leukocytes,Ua: NEGATIVE
Nitrite: NEGATIVE
Protein, ur: NEGATIVE mg/dL
Specific Gravity, Urine: 1.012 (ref 1.005–1.030)
pH: 5 (ref 5.0–8.0)

## 2022-04-28 LAB — ETHANOL: Alcohol, Ethyl (B): <10 mg/dL (ref <10)

## 2022-04-28 LAB — TROPONIN I (HIGH SENSITIVITY): Troponin I (High Sensitivity): 5 ng/L (ref <18)

## 2022-04-28 MED ORDER — TETANUS-DIPHTH-ACELL PERTUSSIS 5-2.5-18.5 LF-MCG/0.5 IM SUSY
0.5000 mL | PREFILLED_SYRINGE | Freq: Once | INTRAMUSCULAR | Status: AC
Start: 2022-04-28 — End: 2022-04-28
  Administered 2022-04-28: 0.5 mL via INTRAMUSCULAR
  Filled 2022-04-28: qty 0.5

## 2022-04-28 MED ORDER — LIDOCAINE-EPINEPHRINE-TETRACAINE (LET) TOPICAL GEL
3.0000 mL | Freq: Once | TOPICAL | Status: AC
Start: 2022-04-28 — End: 2022-04-28
  Administered 2022-04-28: 3 mL via TOPICAL
  Filled 2022-04-28: qty 3

## 2022-04-28 MED ORDER — SODIUM CHLORIDE 0.9 % IV BOLUS
1000.0000 mL | Freq: Once | INTRAVENOUS | Status: AC
  Administered 2022-04-28: 1000 mL via INTRAVENOUS

## 2022-04-28 NOTE — ED Notes (Addendum)
Patient resting in bed.

## 2022-04-28 NOTE — ED Provider Notes (Signed)
[facility] Provider Note    Event Date/Time   First MD Initiated Contact with Patient 04/28/22 1520     (approximate)   History   Fall   HPI  Glenn Barnett is a 71 y.o. male presents to the ER for evaluation of several weeks of lightheadedness and weakness.  No numbness or tingling.  Had a fall today getting out of bed.  States he is living out of a hotel currently hit the back of his head denies any chest pain or pressure no nausea or vomiting.  Was not working out in the heat today.  Denies any dysuria.  No palpitations no melena no hematochezia.     Physical Exam   Triage Vital Signs: ED Triage Vitals  Enc Vitals Group     BP 04/28/22 1237 92/61     Pulse Rate 04/28/22 1237 92     Resp 04/28/22 1237 18     Temp 04/28/22 1237 97.8 F (36.6 C)     Temp Source 04/28/22 1237 Oral     SpO2 04/28/22 1237 100 %     Weight 04/28/22 1239 255 lb (115.7 kg)     Height 04/28/22 1239 6\' 3"  (1.905 m)     Head Circumference --      Peak Flow --      Pain Score --      Pain Loc --      Pain Edu? --      Excl. in GC? --     Most recent vital signs: Vitals:   04/28/22 1821 04/28/22 1833  BP: (!) 152/79 (!) 128/58  Pulse:  62  Resp:  18  Temp:  98.1 F (36.7 C)  SpO2:  99%     Constitutional: Alert  Eyes: Conjunctivae are normal.  Head: Atraumatic. Nose: No congestion/rhinnorhea. Mouth/Throat: Mucous membranes are moist.   Neck: Painless ROM.  Cardiovascular:   Good peripheral circulation. Respiratory: Normal respiratory effort.  No retractions.  Gastrointestinal: Soft and nontender.  Musculoskeletal:  no deformity Neurologic:  MAE spontaneously. No gross focal neurologic deficits are appreciated.  Skin:  Skin is warm, dry and intact. No rash noted. Psychiatric: Mood and affect are normal. Speech and behavior are normal.    ED Results / Procedures / Treatments   Labs (all labs ordered are listed, but only abnormal results are  displayed) Labs Reviewed  BASIC METABOLIC PANEL - Abnormal; Notable for the following components:      Result Value   Glucose, Bld 135 (*)    Creatinine, Ser 1.69 (*)    GFR, Estimated 43 (*)    All other components within normal limits  CBC - Abnormal; Notable for the following components:   RBC 5.83 (*)    All other components within normal limits  URINALYSIS, ROUTINE W REFLEX MICROSCOPIC - Abnormal; Notable for the following components:   Color, Urine YELLOW (*)    APPearance CLEAR (*)    All other components within normal limits  ETHANOL  CBG MONITORING, ED  TROPONIN I (HIGH SENSITIVITY)     EKG  ED ECG REPORT I, 04/30/22, the attending physician, personally viewed and interpreted this ECG.   Date: 04/28/2022  EKG Time: 12:46  Rate: 95  Rhythm: sinus  Axis: normal  Intervals:normal  ST&T Change: no stemi, no depressions    RADIOLOGY Please see ED Course for my review and interpretation.  I personally reviewed all radiographic images ordered to evaluate for the above acute complaints  and reviewed radiology reports and findings.  These findings were personally discussed with the patient.  Please see medical record for radiology report.    PROCEDURES:  Critical Care performed:   Procedures   MEDICATIONS ORDERED IN ED: Medications  sodium chloride 0.9 % bolus 1,000 mL (0 mLs Intravenous Stopped 04/28/22 1834)  lidocaine-EPINEPHrine-tetracaine (LET) topical gel (3 mLs Topical Given 04/28/22 1622)  Tdap (BOOSTRIX) injection 0.5 mL (0.5 mLs Intramuscular Given 04/28/22 1626)     IMPRESSION / MDM / ASSESSMENT AND PLAN / ED COURSE  I reviewed the triage vital signs and the nursing notes.                              Differential diagnosis includes, but is not limited to, dehydration, electrolyte abnormality, anemia, dysrhythmia, pneumonia, CVA, seizure, SDH, IPH  Patient presented to the ER for evaluation of symptoms as described above.  He is  clinically well-appearing in no acute distress does have small superficial injury to his scalp with some bleeding that is controlled will place let we will update his tetanus.  CT imaging of the head and neck ordered out of triage on my review and interpretation does not show any evidence of fracture IPH or SDH.  Clinically is not consistent with CVA.  Not consistent with seizure.  Patient without syncope or LOC.  Will observe on cardiac monitor given his age and risk factors will check troponin though I doubt cardiac etiology.  Does appear clinically dehydrated therefore will give IV fluids.   Clinical Course as of 04/28/22 1839  Fri Apr 28, 2022  1611 X-ray on my review and interpretation does not show any evidence of infiltrate or consolidation. [PR]  1640 Patient up ambulating to and from the bathroom no acute distress [PR]  1701 Further examination patient's wound of the left scalp is small superficial well approximated laceration does not require staple or stitches.  Hemostatic. [PR]  1736 Patient appears significantly improved after IV fluids.  He is tolerating p.o. urinalysis without infection.  Troponin negative.  No anemia.  Remains well and nontoxic-appearing.  Blood pressure improved after IV fluids here though has been normotensive I do suspect he is a little bit dehydrated clinically.  At this point does appear stable and appropriate for outpatient follow-up. [PR]    Clinical Course User Index [PR] Willy Eddy, MD       FINAL CLINICAL IMPRESSION(S) / ED DIAGNOSES   Final diagnoses:  Fall, initial encounter  Dehydration     Rx / DC Orders   ED Discharge Orders     None        Note:  This document was prepared using Dragon voice recognition software and may include unintentional dictation errors.    Willy Eddy, MD 04/28/22 [phone]

## 2022-04-28 NOTE — ED Notes (Signed)
This RN attempted to get VS on pt, pt. States he needs to have a bowel movement first. Declines VS.

## 2022-04-28 NOTE — ED Triage Notes (Signed)
Pt comes into the ED via EMS from  [facility] lodge with c/o dizziness for several weeks fell this morning and hit his head on the bed rail with lac , controlled bleeding, denies LOC, not on blood thinners, homeless. Pt has unsteady gait. A/ox4   100/60 CBG137 98%RA HR100

## 2022-06-19 ENCOUNTER — Other Ambulatory Visit: Payer: Self-pay

## 2022-06-19 ENCOUNTER — Emergency Department
Admission: EM | Admit: 2022-06-19 | Discharge: 2022-06-19 | Disposition: A | Discharge status: Home / Self Care | Payer: Medicare Other | Attending: Emergency Medicine | Admitting: Emergency Medicine

## 2022-06-19 ENCOUNTER — Encounter: Payer: Self-pay | Admitting: Emergency Medicine

## 2022-06-19 DIAGNOSIS — E860 Dehydration: Secondary | ICD-10-CM | POA: Diagnosis not present

## 2022-06-19 DIAGNOSIS — E119 Type 2 diabetes mellitus without complications: Secondary | ICD-10-CM | POA: Insufficient documentation

## 2022-06-19 DIAGNOSIS — Z20822 Contact with and (suspected) exposure to covid-19: Secondary | ICD-10-CM | POA: Diagnosis not present

## 2022-06-19 DIAGNOSIS — R42 Dizziness and giddiness: Secondary | ICD-10-CM | POA: Diagnosis present

## 2022-06-19 LAB — BASIC METABOLIC PANEL
Anion gap: 10 (ref 5–15)
BUN: 16 mg/dL (ref 8–23)
CO2: 22 mmol/L (ref 22–32)
Calcium: 9 mg/dL (ref 8.9–10.3)
Chloride: 106 mmol/L (ref 98–111)
Creatinine, Ser: 1.26 mg/dL — ABNORMAL HIGH (ref 0.61–1.24)
GFR, Estimated: >60 mL/min (ref >60)
Glucose, Bld: 190 mg/dL — ABNORMAL HIGH (ref 70–99)
Potassium: 3.7 mmol/L (ref 3.5–5.1)
Sodium: 138 mmol/L (ref 135–145)

## 2022-06-19 LAB — CBC
HCT: 52.8 % — ABNORMAL HIGH (ref 39.0–52.0)
Hemoglobin: 17.1 g/dL — ABNORMAL HIGH (ref 13.0–17.0)
MCH: 28.5 pg (ref 26.0–34.0)
MCHC: 32.4 g/dL (ref 30.0–36.0)
MCV: 88 fL (ref 80.0–100.0)
Platelets: 321 10*3/uL (ref 150–400)
RBC: 6 MIL/uL — ABNORMAL HIGH (ref 4.22–5.81)
RDW: 13.7 % (ref 11.5–15.5)
WBC: 10.4 10*3/uL (ref 4.0–10.5)
nRBC: 0 % (ref 0.0–0.2)

## 2022-06-19 LAB — URINALYSIS, ROUTINE W REFLEX MICROSCOPIC
Bilirubin Urine: NEGATIVE
Glucose, UA: NEGATIVE mg/dL
Hgb urine dipstick: NEGATIVE
Ketones, ur: NEGATIVE mg/dL
Leukocytes,Ua: NEGATIVE
Nitrite: NEGATIVE
Protein, ur: NEGATIVE mg/dL
Specific Gravity, Urine: 1.016 (ref 1.005–1.030)
pH: 5 (ref 5.0–8.0)

## 2022-06-19 LAB — SARS CORONAVIRUS 2 BY RT PCR: SARS Coronavirus 2 by RT PCR: NEGATIVE

## 2022-06-19 MED ORDER — SODIUM CHLORIDE 0.9 % IV BOLUS
1000.0000 mL | Freq: Once | INTRAVENOUS | Status: AC
  Administered 2022-06-19: 1000 mL via INTRAVENOUS

## 2022-06-19 MED ORDER — METFORMIN HCL 500 MG PO TABS: 500.0000 mg | ORAL_TABLET | Freq: Two times a day (BID) | ORAL | 2 refills | Status: DC

## 2022-06-19 NOTE — ED Triage Notes (Signed)
Pt via EMS from homeless shelter. States has been dizzy this morning. States he did have a fall a week ago from the dizziness, denies head injury. Denies any pain at this time. Pt has been out of his metformin for a few days and was hoping for a refill. Pt is A&Ox4 and NAD.

## 2022-06-19 NOTE — Discharge Instructions (Signed)
Your lab tests and covid test were all okay today.

## 2022-06-19 NOTE — ED Provider Notes (Signed)
Procedures     ----------------------------------------- 12:50 PM on 06/19/2022 ----------------------------------------- Patient seen by social work, given shelter resources.     Sharman Cheek, MD 06/19/22 1250

## 2022-06-19 NOTE — ED Triage Notes (Signed)
Pt via EMS from homeless shelter, pt c/o dizziness for the last 2 hours. States he been out of his metformin for the past 2 days. Pt requesting the medication refill. No recent falls.  Pt is A&Ox4 and NAD  175 CBG 105/78 113 HR  98% on RA

## 2022-06-19 NOTE — TOC Initial Note (Signed)
Transition of Care [facility]) - Initial/Assessment Note    Patient Details  Name: Glenn Barnett MRN: [id number] Date of Birth: 1951-03-21  Transition of Care [facility]) CM/SW Contact:    Shelbie Hutching, RN Phone Number: 06/19/2022, 1:34 PM  Clinical Narrative:                 Patient seen in the emergency room for dizziness and he has been out of his metformin for a couple of days.  Patient is medically cleared for discharge but reports to MD that he is homeless.  RNCM met with patient at the bedside and asked how he was doing.  He says not good because he is homeless.  Patient reports being homeless for a while now.  He has been staying at the shelter but he can't stay there during the day.  He has a daughter but can't stay with her because she has 5 children.  He gets SSI $1900/month, he reports he has no money, patient could not say what he spent all his money on.  RNCM provided patient with a list of [employer] for [facility].  Rent/Utility/Housing  Agency Name: [facility] Address: [address] [place], [place] [address] Phone: [phone] Email: [email or web address] Website: www.alamanceservices.org Service(s) Offered: Housing services, self-sufficiency, congregate meal  program, weatherization program, Administrator, sports program, emergency food assistance,  housing counseling, home ownership program, wheels -towork program.  Agency Name: [facility] Address: [address] [place], [place] [address] Phone: [phone] ([phone] (8p- 10p) Email: [email or web address] Website: www.[email or web address].org Service(s) Offered: A program for homeless and/or needy men that includes one-on-one counseling, life skills training and job rehabilitation.  Agency Name: [employer] [facility] [place] Address: [address] [place], [place] [address] Phone: [phone] Website:  www.alliedchurches.org Service(s) Offered: Assistance to needy in emergency with utility bills, heating  fuel, and prescriptions. Shelter for homeless 7pm-7am. February 01, 2017 15  Agency Name: [facility] [place] (Developmentally Disabled) Address: [address], [place], [place] [address] Phone: [phone] Contact Person: Susanne Greenhouse Email: [email or web address] Website: [email or web address] Service(s) Offered: Helps individuals with developmental disabilities move  from housing that is more restrictive to homes where they  can achieve greater independence and have more  opportunities.  Agency Name: [employer] Address: [address], [place], [place] [address] Phone: [phone] Email: [email or web address] Website: www.burlingtonhousingauthority.org Service(s) Offered: Provides affordable housing for low-income families,  elderly, and disabled individuals. Offer a wide range of  programs and services, from financial planning to afterschool and summer programs.  Agency Name: [place] Address: [address] [place], [place] [address] Phone: [phone] Service(s) Offered: Child support services; child welfare services; food stamps;  Medicaid; work first family assistance; and aid with fuel,  rent, food and medicine.  Agency Name: [facility] [place]. Address: [facility] [address] [place], [place]  [address] Phone: [phone] Website: www.familyabuseservices.org Service(s) Offered: 24 hour Crisis Line: [phone]; 24 hour Emergency Shelter;  Transitional Housing; Support Groups; Lexicographer;  [employer]; Hispanic Outreach: [phone];  [place]: [phone]. February 01, 2017 16  Agency Name: [facility]. Address: [address], [place],  [address] Phone: [phone] Service(s) Offered: CAP Services; Home and [employer]; Individual  or  Group Supports; Respite Care Non-Institutional Nursing;  Residential Supports; Respite Care and [place]; Transportation; Family and Friends Night;  Recreational Activities; Three Nutritious Meals/Snacks;  Consultation with Registered Dietician;  Twenty-four hour  Registered Nurse Access; Daily and [employer]; [place] Leaves; [place] for the [employer] (During Summer Months) Bingo Night (Every  Wednesday Night); Special Populations Dance Night  (Every Tuesday Night); Professional Hair Care Services.  Agency Name: [facility] Address: [address], [place], [place] [address] Phone: [phone] Contact Person: Flonnie Hailstone Website: [email or web address], [email or web address]) Offered: Residential treatment facility for women; food and  clothing, educational & employment development and  transportation to work; Psychologist, counselling of financial skills;  parenting and family reunification; emotional and spiritual  support; transitional housing for program graduates.  Agency Name: [employer] Address: [address], [place], [place] [address] Phone: [phone] Email: [email or web address] Website: http://www.west.biz/ Service(s) Offered: Public housing units for elderly, disabled, and low income  people; housing choice vouchers for income eligible  applicants; shelter plus care vouchers; and [employer] program. February 01, 2017 17  Agency Name: [facility] Address: [place] [address] [place], [place] [address] Phone: [phone] Email: habitat1@netzero .net Website: www.habitatalamance.org Service(s) Offered: Build houses for families in need of decent housing. Each  adult in the family must invest 200 hours of labor on  someone else's house, work with volunteers to build their  own house, attend classes on budgeting, home maintenance, yard care, and attend homeowner association   meetings.  Agency Name: [facility] Address: [place] [address], [place], [place] [address] Phone: [phone] Website: www.rsli.org Service(s) Offered: Intermediate care facilities for mentally retarded,  Supervised Living in group homes for adults with  developmental disabilities, Supervised Living for people  who have dual diagnoses (MRMI), Independent Living,  Supported Living, respite and a variety of CAP services,  pre-vocational services, day supports, and [employer].  Agency Name: [facility] Phone: [phone] Website: www.NCForeclosurePrevention.gov Service(s) Offered: Zero-interest, deferred loans to homeowners struggling to  pay their mortgage. Call for more information     Barriers to Discharge: Barriers Resolved   Patient Goals and CMS Choice Patient states their goals for this hospitalization and ongoing recovery are:: patient does not state goals      Expected Discharge Plan and Services                                                Prior Living Arrangements/Services                       Activities of Daily Living      Permission Sought/Granted                  Emotional Assessment              Admission diagnosis:  dizziness, ems There are no problems to display for this patient.  PCP:  Pcp, No Pharmacy:  No Pharmacies Listed    Social Determinants of Health (SDOH) Interventions    Readmission Risk Interventions     No data to display

## 2022-06-19 NOTE — ED Notes (Signed)
Pt reports homeless at time of d/c. Spoke with MD social work consult placed.

## 2022-06-19 NOTE — ED Provider Notes (Signed)
[facility] Provider Note    Event Date/Time   First MD Initiated Contact with Patient 06/19/22 0732     (approximate)   History   Chief Complaint: Dizziness   HPI  Glenn Barnett is a 71 y.o. male with a history of diabetes on metformin who comes the ED complaining of lightheadedness this morning.  Denies any pain cough body aches or fever.  He notes he has been out of his metformin recently.  Denies dysuria or urinary frequency or polydipsia.  Otherwise feels well.     Physical Exam   Triage Vital Signs: ED Triage Vitals  Enc Vitals Group     BP 06/19/22 0719 109/87     Pulse Rate 06/19/22 0719 (!) 109     Resp 06/19/22 0719 16     Temp 06/19/22 0719 98.7 F (37.1 C)     Temp src --      SpO2 06/19/22 0719 98 %     Weight 06/19/22 0720 245 lb (111.1 kg)     Height 06/19/22 0720 6\' 4"  (1.93 m)     Head Circumference --      Peak Flow --      Pain Score 06/19/22 0720 0     Pain Loc --      Pain Edu? --      Excl. in GC? --     Most recent vital signs: Vitals:   06/19/22 0900 06/19/22 0930  BP: (!) 144/97 (!) 144/86  Pulse: 71 78  Resp: 16 20  Temp:    SpO2: 96% 99%    General: Awake, no distress.  CV:  Good peripheral perfusion.  Regular rate and rhythm Resp:  Normal effort.  Clear to auscultation bilaterally Abd:  No distention.  Soft nontender Other:  Cranial nerves III through XII intact.  PERRL.  Moving all extremities, normal coordination.  Dry mucous membranes.  No lower extremity edema or calf tenderness   ED Results / Procedures / Treatments   Labs (all labs ordered are listed, but only abnormal results are displayed) Labs Reviewed  BASIC METABOLIC PANEL - Abnormal; Notable for the following components:      Result Value   Glucose, Bld 190 (*)    Creatinine, Ser 1.26 (*)    All other components within normal limits  CBC - Abnormal; Notable for the following components:   RBC 6.00 (*)    Hemoglobin 17.1 (*)    HCT  52.8 (*)    All other components within normal limits  URINALYSIS, ROUTINE W REFLEX MICROSCOPIC - Abnormal; Notable for the following components:   Color, Urine YELLOW (*)    APPearance HAZY (*)    All other components within normal limits  SARS CORONAVIRUS 2 BY RT PCR  CBG MONITORING, ED     EKG Interpreted by me Sinus rhythm rate of 95.  Normal axis, normal intervals.  Normal QRS ST segments and T waves.  No ischemic changes.   RADIOLOGY    PROCEDURES:  Procedures   MEDICATIONS ORDERED IN ED: Medications  sodium chloride 0.9 % bolus 1,000 mL (0 mLs Intravenous Stopped 06/19/22 0958)     IMPRESSION / MDM / ASSESSMENT AND PLAN / ED COURSE  I reviewed the triage vital signs and the nursing notes.                              Differential diagnosis includes, but is  not limited to, dehydration, AKI, anemia, COVID/viral illness, hyperglycemia, UTI  Patient's presentation is most consistent with acute presentation with potential threat to life or bodily function.  Patient presents with lightheadedness this morning, clinically appears to be dehydrated.  Vital signs are normal and he is nontoxic.  Labs including urinalysis COVID test and serum labs are all unremarkable.  We will refill his metformin.  Stable for discharge       FINAL CLINICAL IMPRESSION(S) / ED DIAGNOSES   Final diagnoses:  Dehydration     Rx / DC Orders   ED Discharge Orders          Ordered    metFORMIN (GLUCOPHAGE) 500 MG tablet  2 times daily with meals        06/19/22 1115             Note:  This document was prepared using Dragon voice recognition software and may include unintentional dictation errors.   Sharman Cheek, MD 06/19/22 1118

## 2022-06-19 NOTE — ED Notes (Signed)
Ambulated to bathroom with steady gate, declines dizziness at this time.

## 2022-06-21 ENCOUNTER — Emergency Department
Admission: EM | Admit: 2022-06-21 | Discharge: 2022-06-21 | Disposition: A | Discharge status: Home / Self Care | Payer: Medicare Other | Attending: Emergency Medicine | Admitting: Emergency Medicine

## 2022-06-21 ENCOUNTER — Encounter: Payer: Self-pay | Admitting: Emergency Medicine

## 2022-06-21 ENCOUNTER — Other Ambulatory Visit: Payer: Self-pay

## 2022-06-21 DIAGNOSIS — E860 Dehydration: Secondary | ICD-10-CM | POA: Diagnosis not present

## 2022-06-21 DIAGNOSIS — Z7984 Long term (current) use of oral hypoglycemic drugs: Secondary | ICD-10-CM | POA: Insufficient documentation

## 2022-06-21 DIAGNOSIS — R42 Dizziness and giddiness: Secondary | ICD-10-CM | POA: Diagnosis present

## 2022-06-21 DIAGNOSIS — F32A Depression, unspecified: Secondary | ICD-10-CM

## 2022-06-21 DIAGNOSIS — E1165 Type 2 diabetes mellitus with hyperglycemia: Secondary | ICD-10-CM | POA: Diagnosis not present

## 2022-06-21 LAB — CBC WITH DIFFERENTIAL/PLATELET
Abs Immature Granulocytes: 0.03 10*3/uL (ref 0.00–0.07)
Basophils Absolute: 0 10*3/uL (ref 0.0–0.1)
Basophils Relative: 0 %
Eosinophils Absolute: 0.1 10*3/uL (ref 0.0–0.5)
Eosinophils Relative: 1 %
HCT: 50.4 % (ref 39.0–52.0)
Hemoglobin: 16.6 g/dL (ref 13.0–17.0)
Immature Granulocytes: 0 %
Lymphocytes Relative: 12 %
Lymphs Abs: 1.2 10*3/uL (ref 0.7–4.0)
MCH: 29.6 pg (ref 26.0–34.0)
MCHC: 32.9 g/dL (ref 30.0–36.0)
MCV: 89.8 fL (ref 80.0–100.0)
Monocytes Absolute: 0.8 10*3/uL (ref 0.1–1.0)
Monocytes Relative: 7 %
Neutro Abs: 8.5 10*3/uL — ABNORMAL HIGH (ref 1.7–7.7)
Neutrophils Relative %: 80 %
Platelets: 328 10*3/uL (ref 150–400)
RBC: 5.61 MIL/uL (ref 4.22–5.81)
RDW: 13.4 % (ref 11.5–15.5)
WBC: 10.6 10*3/uL — ABNORMAL HIGH (ref 4.0–10.5)
nRBC: 0 % (ref 0.0–0.2)

## 2022-06-21 LAB — URINALYSIS, ROUTINE W REFLEX MICROSCOPIC
Bilirubin Urine: NEGATIVE
Glucose, UA: NEGATIVE mg/dL
Hgb urine dipstick: NEGATIVE
Ketones, ur: NEGATIVE mg/dL
Leukocytes,Ua: NEGATIVE
Nitrite: NEGATIVE
Protein, ur: NEGATIVE mg/dL
Specific Gravity, Urine: 1.013 (ref 1.005–1.030)
pH: 5 (ref 5.0–8.0)

## 2022-06-21 LAB — COMPREHENSIVE METABOLIC PANEL
ALT: 13 U/L (ref 0–44)
AST: 18 U/L (ref 15–41)
Albumin: 3.5 g/dL (ref 3.5–5.0)
Alkaline Phosphatase: 59 U/L (ref 38–126)
Anion gap: 8 (ref 5–15)
BUN: 16 mg/dL (ref 8–23)
CO2: 24 mmol/L (ref 22–32)
Calcium: 8.9 mg/dL (ref 8.9–10.3)
Chloride: 108 mmol/L (ref 98–111)
Creatinine, Ser: 1.17 mg/dL (ref 0.61–1.24)
GFR, Estimated: >60 mL/min (ref >60)
Glucose, Bld: 159 mg/dL — ABNORMAL HIGH (ref 70–99)
Potassium: 3.2 mmol/L — ABNORMAL LOW (ref 3.5–5.1)
Sodium: 140 mmol/L (ref 135–145)
Total Bilirubin: 1.3 mg/dL — ABNORMAL HIGH (ref 0.3–1.2)
Total Protein: 6.7 g/dL (ref 6.5–8.1)

## 2022-06-21 LAB — TROPONIN I (HIGH SENSITIVITY)
Troponin I (High Sensitivity): 6 ng/L (ref <18)
Troponin I (High Sensitivity): 5 ng/L (ref <18)

## 2022-06-21 MED ORDER — SODIUM CHLORIDE 0.9 % IV BOLUS
2000.0000 mL | Freq: Once | INTRAVENOUS | Status: AC
  Administered 2022-06-21: 2000 mL via INTRAVENOUS

## 2022-06-21 MED ORDER — METFORMIN HCL 500 MG PO TABS: 500.0000 mg | ORAL_TABLET | Freq: Two times a day (BID) | ORAL | 2 refills | Status: DC

## 2022-06-21 MED ORDER — SODIUM CHLORIDE 0.9 % IV BOLUS
1000.0000 mL | Freq: Once | INTRAVENOUS | Status: AC
  Administered 2022-06-21: 1000 mL via INTRAVENOUS

## 2022-06-21 NOTE — Consult Note (Signed)
[facility] Psychiatry Consult   Reason for Consult:  depression Referring Physician:  Bradler Patient Identification: Glenn Barnett MRN:  [id number] Principal Diagnosis: Depression Diagnosis:  Principal Problem:   Depression   Total Time spent with patient: 30 minutes  Subjective:   Glenn Barnett is a 71 y.o. male patient admitted with dizziness.  Marland Kitchen  HPI:  Patient presents to ED with dizziness. He has had  similar presentations in the past few days. He states he is living at a shelter, currently homeless, although he says he has a car and gets a retirement income. He has been staying at [facility]. Social work/TOC saw him and gave him resources on 9/11. He states he comes to the hospital when he runs out of medicine for his medical problems. He reports that  he has a daughter but she cannot have him live with her because "she has 5 kids." Chart review reveals that he was in [place] in July 2023. He says he drove up there to visit family but they are not a resource for him, either. He says he is from [place]. Patient states that [employer] and his daughter are working to get him into a "sober living" house; he says he has not had alcohol in 30 years. He says he gets paid 9/20 and he should be able to get into the house. He denies other illicit drug use.  He states that the remark he made about "no will to live" was not really accurate, he is just tired of coming to the hospital. He says he gets discouraged about his general health and is looking forward to getting into a permanent living situation. Patient shows insight into link between depression and homelessness  On evaluation, patient says he has a history of depression "a long time ago." Took something for depression 20 years ago; he does not remember what it was. Patient denies suicidal thoughts, homicidal thoughts, paranoia, auditory or visual hallucinations. He is speaking in linear, logical sentences, alert  and oriented x 4.  He is receiving IV fluids, laying on stretcher. Appropriate conversation, smiling at times during interactions.  Patient is here voluntarily and is denying desire for admission. He does not meet criteria for involuntary commitment. Resources for outpatient mental health treatment have been provided.   Past Psychiatric History: per report of patient, depression over 20 years ago. Distant history of alcohol abuse  Risk to Self:   Risk to Others:   Prior Inpatient Therapy:   Prior Outpatient Therapy:    Past Medical History:  Past Medical History:  Diagnosis Date   Brain aneurysm    Diabetes mellitus without complication ([place])    History reviewed. No pertinent surgical history. Family History: No family history on file. Family Psychiatric  History:  Social History:  Social History   Substance and Sexual Activity  Alcohol Use Not Currently     Social History   Substance and Sexual Activity  Drug Use Not Currently    Social History   Socioeconomic History   Marital status: Divorced    Spouse name: Not on file   Number of children: Not on file   Years of education: Not on file   Highest education level: Not on file  Occupational History   Not on file  Tobacco Use   Smoking status: Never   Smokeless tobacco: Never  Substance and Sexual Activity   Alcohol use: Not Currently   Drug use: Not Currently   Sexual activity: Not  on file  Other Topics Concern   Not on file  Social History Narrative   Not on file   Social Determinants of Health   Financial Resource Strain: Not on file  Food Insecurity: Not on file  Transportation Needs: Not on file  Physical Activity: Not on file  Stress: Not on file  Social Connections: Not on file   Additional Social History:    Allergies:   Allergies  Allergen Reactions   Other Other (See Comments)    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    Labs:  Results for orders placed or  performed during the hospital encounter of 06/21/22 (from the past 48 hour(s))  Comprehensive metabolic panel     Status: Abnormal   Collection Time: 06/21/22  7:45 AM  Result Value Ref Range   Sodium 140 135 - 145 mmol/L   Potassium 3.2 (L) 3.5 - 5.1 mmol/L   Chloride 108 98 - 111 mmol/L   CO2 24 22 - 32 mmol/L   Glucose, Bld 159 (H) 70 - 99 mg/dL    Comment: Glucose reference range applies only to samples taken after fasting for at least 8 hours.   BUN 16 8 - 23 mg/dL   Creatinine, Ser 1.17 0.61 - 1.24 mg/dL   Calcium 8.9 8.9 - 10.3 mg/dL   Total Protein 6.7 6.5 - 8.1 g/dL   Albumin 3.5 3.5 - 5.0 g/dL   AST 18 15 - 41 U/L   ALT 13 0 - 44 U/L   Alkaline Phosphatase 59 38 - 126 U/L   Total Bilirubin 1.3 (H) 0.3 - 1.2 mg/dL   GFR, Estimated >60 >60 mL/min    Comment: (NOTE) Calculated using the CKD-EPI Creatinine Equation (2021)    Anion gap 8 5 - 15    Comment: Performed at [facility], [address] [place], [place] [address]  CBC with Differential     Status: Abnormal   Collection Time: 06/21/22  7:45 AM  Result Value Ref Range   WBC 10.6 (H) 4.0 - 10.5 K/uL   RBC 5.61 4.22 - 5.81 MIL/uL   Hemoglobin 16.6 13.0 - 17.0 g/dL   HCT 50.4 39.0 - 52.0 %   MCV 89.8 80.0 - 100.0 fL   MCH 29.6 26.0 - 34.0 pg   MCHC 32.9 30.0 - 36.0 g/dL   RDW 13.4 11.5 - 15.5 %   Platelets 328 150 - 400 K/uL   nRBC 0.0 0.0 - 0.2 %   Neutrophils Relative % 80 %   Neutro Abs 8.5 (H) 1.7 - 7.7 K/uL   Lymphocytes Relative 12 %   Lymphs Abs 1.2 0.7 - 4.0 K/uL   Monocytes Relative 7 %   Monocytes Absolute 0.8 0.1 - 1.0 K/uL   Eosinophils Relative 1 %   Eosinophils Absolute 0.1 0.0 - 0.5 K/uL   Basophils Relative 0 %   Basophils Absolute 0.0 0.0 - 0.1 K/uL   Immature Granulocytes 0 %   Abs Immature Granulocytes 0.03 0.00 - 0.07 K/uL    Comment: Performed at [facility], [place], [place] [address]  Troponin I (High Sensitivity)     Status: None   Collection  Time: 06/21/22  7:45 AM  Result Value Ref Range   Troponin I (High Sensitivity) 6 <18 ng/L    Comment: (NOTE) Elevated high sensitivity troponin I (hsTnI) values and significant  changes across serial measurements may suggest ACS but many  other  chronic and acute conditions are known to elevate hsTnI results.  Refer to the "Links" section for chest pain algorithms and additional  guidance. Performed at [facility], [address] [place], [place] [address]   Troponin I (High Sensitivity)     Status: None   Collection Time: 06/21/22  9:46 AM  Result Value Ref Range   Troponin I (High Sensitivity) 5 <18 ng/L    Comment: (NOTE) Elevated high sensitivity troponin I (hsTnI) values and significant  changes across serial measurements may suggest ACS but many other  chronic and acute conditions are known to elevate hsTnI results.  Refer to the "Links" section for chest pain algorithms and additional  guidance. Performed at [facility], [address] [place], [place] [address]   Urinalysis, Routine w reflex microscopic Urine, Clean Catch     Status: Abnormal   Collection Time: 06/21/22 11:25 AM  Result Value Ref Range   Color, Urine YELLOW (A) YELLOW   APPearance CLEAR (A) CLEAR   Specific Gravity, Urine 1.013 1.005 - 1.030   pH 5.0 5.0 - 8.0   Glucose, UA NEGATIVE NEGATIVE mg/dL   Hgb urine dipstick NEGATIVE NEGATIVE   Bilirubin Urine NEGATIVE NEGATIVE   Ketones, ur NEGATIVE NEGATIVE mg/dL   Protein, ur NEGATIVE NEGATIVE mg/dL   Nitrite NEGATIVE NEGATIVE   Leukocytes,Ua NEGATIVE NEGATIVE    Comment: Performed at [facility], [address] [place], [place] [address]    No current facility-administered medications for this encounter.   Current Outpatient Medications  Medication Sig Dispense Refill   metFORMIN (GLUCOPHAGE) 500 MG tablet Take 1 tablet (500 mg total) by mouth 2 (two) times daily with a meal. 60 tablet 2     Musculoskeletal: Strength & Muscle Tone:  not assessed Gait & Station:  not assessed Patient leans: N/A  Psychiatric Specialty Exam:  Presentation  General Appearance: Appropriate for Environment  Eye Contact:Good  Speech:Clear and Coherent  Speech Volume:Normal  Handedness:No data recorded  Mood and Affect  Mood:Euthymic  Affect:Congruent   Thought Process  Thought Processes:Coherent  Descriptions of Associations:Intact  Orientation:Full (Time, Place and Person)  Thought Content:Logical  History of Schizophrenia/Schizoaffective disorder:No data recorded Duration of Psychotic Symptoms:No data recorded Hallucinations:Hallucinations: None  Ideas of Reference:None  Suicidal Thoughts:Suicidal Thoughts: No  Homicidal Thoughts:Homicidal Thoughts: No   Sensorium  Memory:No data recorded Judgment:Good  Insight:Fair   Executive Functions  Concentration:Fair  Attention Span:Fair  Recall:Fair  Fund of Knowledge:Fair  Language:Fair   Psychomotor Activity  Psychomotor Activity:Psychomotor Activity: Normal   Assets  Assets:Communication Skills; Desire for Improvement; Financial Resources/Insurance; Resilience; Social Support   Sleep  Sleep:Sleep: Good   Physical Exam: Physical Exam Vitals and nursing note reviewed.  HENT:     Nose: No congestion or rhinorrhea.  Eyes:     General:        Right eye: No discharge.        Left eye: No discharge.  Pulmonary:     Effort: Pulmonary effort is normal.  Musculoskeletal:        General: Normal range of motion.     Cervical back: Normal range of motion.  Skin:    General: Skin is dry.  Neurological:     Mental Status: He is alert and oriented to person, place, and time.  Psychiatric:        Attention and Perception: Attention normal.        Mood and Affect: Mood normal.  Speech: Speech normal.        Behavior: Behavior normal. Behavior is cooperative.        Thought Content: Thought  content is not paranoid. Thought content does not include homicidal or suicidal ideation.        Cognition and Memory: Cognition normal.        Judgment: Judgment normal.    Review of Systems  HENT: Negative.    Eyes: Negative.   Respiratory: Negative.    Musculoskeletal: Negative.   Psychiatric/Behavioral:  Positive for depression. Negative for hallucinations, memory loss, substance abuse and suicidal ideas. The patient is not nervous/anxious and does not have insomnia.    Blood pressure (!) 157/75, pulse 81, temperature 98.2 F (36.8 C), temperature source Oral, resp. rate 11, height 6\' 4"  (1.93 m), weight 116.6 kg, SpO2 99 %. Body mass index is 31.28 kg/m.  Treatment Plan Summary: Plan Patient to discharge when medically cleared. Reviewed with EDP  Disposition: No evidence of imminent risk to self or others at present.   Patient does not meet criteria for psychiatric inpatient admission. Supportive therapy provided about ongoing stressors. Discussed crisis plan, support from social network, calling 911, coming to the Emergency Department, and calling Suicide Hotline.  , NP 06/21/2022 6:44 PM

## 2022-06-21 NOTE — ED Triage Notes (Signed)
Patient to ED via [facility] from the [employer] for dizziness. Patient states he was seen here for same 2 days ago but problem has not resolved. Aox4.

## 2022-06-21 NOTE — ED Notes (Signed)
Pt assisted to toilet 

## 2022-06-21 NOTE — ED Notes (Signed)
Pt assisted back to bed. Pt endorsing depression at this time "I would like to talk to a psych doctor, I have no will to live."  Pt denies SI or HI at this time but does feel depressed. Pt endorsing history of the same, and has been treated with medication, but cannot remember which medication.  Pt back to bed,no other needs at this time.

## 2022-06-21 NOTE — ED Provider Notes (Signed)
[facility] Provider Note   Event Date/Time   First MD Initiated Contact with Patient 06/21/22 0732     (approximate) History  Dizziness  HPI Glenn Barnett is a 71 y.o. male with a past medical history of type 2 diabetes on metformin who presents for the second time in 3 days complaining of orthostatic lightheadedness as well as worsening lightheadedness at rest.  EMS state that patient was hyperglycemic with BG in the 240s.  Patient states that he did take all of his medications on time and as prescribed today. ROS: Patient currently denies any vision changes, tinnitus, difficulty speaking, facial droop, sore throat, palpitations, chest pain, shortness of breath, abdominal pain, nausea/vomiting/diarrhea, dysuria, or weakness/numbness/paresthesias in any extremity   Physical Exam  Triage Vital Signs: ED Triage Vitals  Enc Vitals Group     BP      Pulse      Resp      Temp      Temp src      SpO2      Weight      Height      Head Circumference      Peak Flow      Pain Score      Pain Loc      Pain Edu?      Excl. in GC?    Most recent vital signs: Vitals:   06/21/22 1155 06/21/22 1330  BP: 124/80 (!) 157/75  Pulse: 89 81  Resp: 16 11  Temp:    SpO2: 100% 99%   General: Awake, oriented x4. CV:  Good peripheral perfusion.  Resp:  Normal effort.  Abd:  No distention.  Other:  Overweight elderly Hispanic male sitting in stretcher in no acute distress ED Results / Procedures / Treatments  Labs (all labs ordered are listed, but only abnormal results are displayed) Labs Reviewed  COMPREHENSIVE METABOLIC PANEL - Abnormal; Notable for the following components:      Result Value   Potassium 3.2 (*)    Glucose, Bld 159 (*)    Total Bilirubin 1.3 (*)    All other components within normal limits  CBC WITH DIFFERENTIAL/PLATELET - Abnormal; Notable for the following components:   WBC 10.6 (*)    Neutro Abs 8.5 (*)    All other components within  normal limits  URINALYSIS, ROUTINE W REFLEX MICROSCOPIC - Abnormal; Notable for the following components:   Color, Urine YELLOW (*)    APPearance CLEAR (*)    All other components within normal limits  TROPONIN I (HIGH SENSITIVITY)  TROPONIN I (HIGH SENSITIVITY)   EKG ED ECG REPORT I, Merwyn Katos, the attending physician, personally viewed and interpreted this ECG. Date: 06/21/2022 EKG Time: 0740 Rate: 98 Rhythm: normal sinus rhythm QRS Axis: normal Intervals: normal ST/T Wave abnormalities: normal Narrative Interpretation: no evidence of acute ischemia PROCEDURES: Critical Care performed: No .1-3 Lead EKG Interpretation  Performed by: Merwyn Katos, MD Authorized by: Merwyn Katos, MD     Interpretation: normal     ECG rate:  80   ECG rate assessment: normal     Rhythm: sinus rhythm     Ectopy: none     Conduction: normal    MEDICATIONS ORDERED IN ED: Medications  sodium chloride 0.9 % bolus 1,000 mL (0 mLs Intravenous Stopped 06/21/22 1003)  sodium chloride 0.9 % bolus 2,000 mL (2,000 mLs Intravenous New Bag/Given 06/21/22 1206)   IMPRESSION / MDM / ASSESSMENT AND PLAN /  ED COURSE  I reviewed the triage vital signs and the nursing notes.                             The patient is on the cardiac monitor to evaluate for evidence of arrhythmia and/or significant heart rate changes. Patient's presentation is most consistent with acute presentation with potential threat to life or bodily function. This patient presents with generalized weakness and fatigue likely secondary to dehydration. Suspect acute kidney injury of prerenal origin. Doubt intrinsic renal dysfunction or obstructive nephropathy. Considered alternate etiologies of the patients symptoms including infectious processes, severe metabolic derangements or electrolyte abnormalities, ischemia/ACS, heart failure, and intracranial/central processes but think these are unlikely given the history and physical exam.   Patient also expressing suicidal ideation and was evaluated by our psychiatry team and deemed safe for discharge at this time with outpatient resources and follow-up.  Plan: labs, 3 L fluid resuscitation, pain/nausea control, reassessment  Dispo: Discharge with psych follow-up   FINAL CLINICAL IMPRESSION(S) / ED DIAGNOSES   Final diagnoses:  Dehydration  Orthostatic lightheadedness   Rx / DC Orders   ED Discharge Orders          Ordered    metFORMIN (GLUCOPHAGE) 500 MG tablet  2 times daily with meals        06/21/22 1330           Note:  This document was prepared using Dragon voice recognition software and may include unintentional dictation errors.   Merwyn Katos, MD 06/21/22 [phone]

## 2022-06-22 ENCOUNTER — Emergency Department
Admission: EM | Admit: 2022-06-22 | Discharge: 2022-06-22 | Disposition: A | Discharge status: Home / Self Care | Payer: Medicare Other | Attending: Emergency Medicine | Admitting: Emergency Medicine

## 2022-06-22 DIAGNOSIS — R42 Dizziness and giddiness: Secondary | ICD-10-CM | POA: Diagnosis present

## 2022-06-22 DIAGNOSIS — E119 Type 2 diabetes mellitus without complications: Secondary | ICD-10-CM | POA: Diagnosis not present

## 2022-06-22 DIAGNOSIS — I951 Orthostatic hypotension: Secondary | ICD-10-CM | POA: Diagnosis not present

## 2022-06-22 DIAGNOSIS — R002 Palpitations: Secondary | ICD-10-CM

## 2022-06-22 LAB — CBG MONITORING, ED: Glucose-Capillary: 97 mg/dL (ref 70–99)

## 2022-06-22 NOTE — ED Notes (Signed)
EKG given to Dr. Vicente Males for review

## 2022-06-22 NOTE — ED Notes (Signed)
Pt states coming into the ED due to shortness of breath and having dizziness/blurred vision every morning when he wakes up around 0530-0600. Pt denies current blurred vision. Pt ambulates with no assistance to the bathroom for a BM.

## 2022-06-22 NOTE — ED Triage Notes (Signed)
Dizziness and sob per patient, pt was seen and discharged from here yesterday . Pt axox4

## 2022-06-22 NOTE — ED Provider Notes (Signed)
[facility] Provider Note   Event Date/Time   First MD Initiated Contact with Patient 06/22/22 [phone]     (approximate) History  Dizziness (Was here yesterday for same complaint, dizziness, non orthostatic for ems, no co pain , pt also co of sob )  HPI Glenn Barnett is a 71 y.o. male with a past medical history of type 2 diabetes who presents for the second time in 2 days complaining of similar symptoms including orthostatic lightheadedness and occasional shortness of breath.  Patient had full work-up yesterday with no acute abnormalities but states that he woke up today feeling somewhat similar to yesterday despite not feeling dehydrated.  Patient states that he has been told before that he needed work-up for the symptoms with cardiology but has been unable to get into a cardiologist.  Denies any syncopal episodes ROS: Patient currently denies any vision changes, tinnitus, difficulty speaking, facial droop, sore throat, chest pain, abdominal pain, nausea/vomiting/diarrhea, dysuria, or weakness/numbness/paresthesias in any extremity   Physical Exam  Triage Vital Signs: ED Triage Vitals  Enc Vitals Group     BP      Pulse      Resp      Temp      Temp src      SpO2      Weight      Height      Head Circumference      Peak Flow      Pain Score      Pain Loc      Pain Edu?      Excl. in GC?    Most recent vital signs: Vitals:   06/22/22 0753 06/22/22 0826  BP: 109/83 105/65  Pulse: 98 73  Resp:  12  Temp:  97.8 F (36.6 C)  SpO2:  98%   General: Awake, oriented x4. CV:  Good peripheral perfusion.  Resp:  Normal effort.  Abd:  No distention.  Other:  Elderly African-American male laying in bed in no acute distress.  No orthostatic lightheadedness patient is ambulatory to bathroom without assistance ED Results / Procedures / Treatments  Labs (all labs ordered are listed, but only abnormal results are displayed) Labs Reviewed  CBG MONITORING, ED    EKG ED ECG REPORT I, Merwyn Katos, the attending physician, personally viewed and interpreted this ECG. Date: 06/22/2022 EKG Time: 0745 Rate: 74 Rhythm: normal sinus rhythm QRS Axis: normal Intervals: normal ST/T Wave abnormalities: normal Narrative Interpretation: no evidence of acute ischemia PROCEDURES: Critical Care performed: No .1-3 Lead EKG Interpretation  Performed by: Merwyn Katos, MD Authorized by: Merwyn Katos, MD     Interpretation: normal     ECG rate:  72   ECG rate assessment: normal     Rhythm: sinus rhythm     Ectopy: none     Conduction: normal    MEDICATIONS ORDERED IN ED: Medications - No data to display IMPRESSION / MDM / ASSESSMENT AND PLAN / ED COURSE  I reviewed the triage vital signs and the nursing notes.                             The patient is on the cardiac monitor to evaluate for evidence of arrhythmia and/or significant heart rate changes. Patient's presentation is most consistent with acute presentation with potential threat to life or bodily function. Patient 71 year old male presents for second time in 2 days complaining  of orthostatic lightheadedness.  Patient's vital signs stable as well as patient's orthostatic vital signs without abnormality.  Patient is not hypoglycemic at this time.  At this time I have low suspicion for ACS, arrhythmia, significant dehydration, aortic pathology, or life-threatening valvular disease.  Patient was encouraged to follow-up with scheduled cardiology referral as well as use the local [facility] transportation services for transportation to these hospital visits.  Patient was also given contact information for social work for any further questions he may have about resources in [place].  Dispo: Discharge home with cardiology and PCP follow-up   FINAL CLINICAL IMPRESSION(S) / ED DIAGNOSES   Final diagnoses:  Orthostatic lightheadedness  Palpitations   Rx / DC Orders   ED Discharge  Orders          Ordered    Ambulatory referral to Cardiology       Comments: If you have not heard from the Cardiology office within the next 72 hours please call [phone].   06/22/22 0818           Note:  This document was prepared using Dragon voice recognition software and may include unintentional dictation errors.   Merwyn Katos, MD 06/22/22 [phone]

## 2022-06-23 ENCOUNTER — Emergency Department: Payer: Medicare Other

## 2022-06-23 ENCOUNTER — Observation Stay
Admission: EM | Admit: 2022-06-23 | Discharge: 2022-06-25 | Disposition: A | Discharge status: Home / Self Care | Payer: Medicare Other | Attending: Internal Medicine | Admitting: Internal Medicine

## 2022-06-23 ENCOUNTER — Other Ambulatory Visit: Payer: Self-pay

## 2022-06-23 ENCOUNTER — Observation Stay: Payer: Medicare Other

## 2022-06-23 DIAGNOSIS — S065XAA Traumatic subdural hemorrhage with loss of consciousness status unknown, initial encounter: Secondary | ICD-10-CM

## 2022-06-23 DIAGNOSIS — R7989 Other specified abnormal findings of blood chemistry: Secondary | ICD-10-CM

## 2022-06-23 DIAGNOSIS — E538 Deficiency of other specified B group vitamins: Secondary | ICD-10-CM | POA: Insufficient documentation

## 2022-06-23 DIAGNOSIS — E559 Vitamin D deficiency, unspecified: Secondary | ICD-10-CM | POA: Insufficient documentation

## 2022-06-23 DIAGNOSIS — R0602 Shortness of breath: Secondary | ICD-10-CM | POA: Diagnosis not present

## 2022-06-23 DIAGNOSIS — Z23 Encounter for immunization: Secondary | ICD-10-CM | POA: Insufficient documentation

## 2022-06-23 DIAGNOSIS — R0600 Dyspnea, unspecified: Secondary | ICD-10-CM

## 2022-06-23 DIAGNOSIS — I6200 Nontraumatic subdural hemorrhage, unspecified: Secondary | ICD-10-CM | POA: Diagnosis not present

## 2022-06-23 DIAGNOSIS — R42 Dizziness and giddiness: Secondary | ICD-10-CM | POA: Diagnosis present

## 2022-06-23 DIAGNOSIS — F32A Depression, unspecified: Secondary | ICD-10-CM | POA: Diagnosis present

## 2022-06-23 DIAGNOSIS — R7402 Elevation of levels of lactic acid dehydrogenase (LDH): Secondary | ICD-10-CM | POA: Diagnosis not present

## 2022-06-23 DIAGNOSIS — Z7984 Long term (current) use of oral hypoglycemic drugs: Secondary | ICD-10-CM | POA: Diagnosis not present

## 2022-06-23 DIAGNOSIS — E119 Type 2 diabetes mellitus without complications: Secondary | ICD-10-CM | POA: Diagnosis not present

## 2022-06-23 LAB — CBC WITH DIFFERENTIAL/PLATELET
Abs Immature Granulocytes: 0.03 10*3/uL (ref 0.00–0.07)
Basophils Absolute: 0 10*3/uL (ref 0.0–0.1)
Basophils Relative: 0 %
Eosinophils Absolute: 0.1 10*3/uL (ref 0.0–0.5)
Eosinophils Relative: 1 %
HCT: 50.9 % (ref 39.0–52.0)
Hemoglobin: 16.5 g/dL (ref 13.0–17.0)
Immature Granulocytes: 0 %
Lymphocytes Relative: 17 %
Lymphs Abs: 1.6 10*3/uL (ref 0.7–4.0)
MCH: 28.5 pg (ref 26.0–34.0)
MCHC: 32.4 g/dL (ref 30.0–36.0)
MCV: 88.1 fL (ref 80.0–100.0)
Monocytes Absolute: 0.7 10*3/uL (ref 0.1–1.0)
Monocytes Relative: 8 %
Neutro Abs: 7 10*3/uL (ref 1.7–7.7)
Neutrophils Relative %: 74 %
Platelets: 346 10*3/uL (ref 150–400)
RBC: 5.78 MIL/uL (ref 4.22–5.81)
RDW: 13.4 % (ref 11.5–15.5)
WBC: 9.4 10*3/uL (ref 4.0–10.5)
nRBC: 0 % (ref 0.0–0.2)

## 2022-06-23 LAB — COMPREHENSIVE METABOLIC PANEL
ALT: 11 U/L (ref 0–44)
AST: 20 U/L (ref 15–41)
Albumin: 3.6 g/dL (ref 3.5–5.0)
Alkaline Phosphatase: 64 U/L (ref 38–126)
Anion gap: 9 (ref 5–15)
BUN: 12 mg/dL (ref 8–23)
CO2: 26 mmol/L (ref 22–32)
Calcium: 9.3 mg/dL (ref 8.9–10.3)
Chloride: 107 mmol/L (ref 98–111)
Creatinine, Ser: 1.14 mg/dL (ref 0.61–1.24)
GFR, Estimated: >60 mL/min (ref >60)
Glucose, Bld: 108 mg/dL — ABNORMAL HIGH (ref 70–99)
Potassium: 3.4 mmol/L — ABNORMAL LOW (ref 3.5–5.1)
Sodium: 142 mmol/L (ref 135–145)
Total Bilirubin: 1.2 mg/dL (ref 0.3–1.2)
Total Protein: 6.8 g/dL (ref 6.5–8.1)

## 2022-06-23 LAB — IRON AND TIBC
Iron: 49 ug/dL (ref 45–182)
Saturation Ratios: 20 % (ref 17.9–39.5)
TIBC: 244 ug/dL — ABNORMAL LOW (ref 250–450)
UIBC: 195 ug/dL

## 2022-06-23 LAB — URINALYSIS, ROUTINE W REFLEX MICROSCOPIC
Bilirubin Urine: NEGATIVE
Glucose, UA: NEGATIVE mg/dL
Hgb urine dipstick: NEGATIVE
Ketones, ur: NEGATIVE mg/dL
Leukocytes,Ua: NEGATIVE
Nitrite: NEGATIVE
Protein, ur: NEGATIVE mg/dL
Specific Gravity, Urine: >1.046 — ABNORMAL HIGH (ref 1.005–1.030)
pH: 6 (ref 5.0–8.0)

## 2022-06-23 LAB — LACTIC ACID, PLASMA
Lactic Acid, Venous: 2.1 mmol/L (ref 0.5–1.9)
Lactic Acid, Venous: 2 mmol/L (ref 0.5–1.9)

## 2022-06-23 LAB — VITAMIN B12: Vitamin B-12: 125 pg/mL — ABNORMAL LOW (ref 180–914)

## 2022-06-23 LAB — VITAMIN D 25 HYDROXY (VIT D DEFICIENCY, FRACTURES): Vit D, 25-Hydroxy: 17.16 ng/mL — ABNORMAL LOW (ref 30–100)

## 2022-06-23 LAB — TROPONIN I (HIGH SENSITIVITY): Troponin I (High Sensitivity): 5 ng/L (ref <18)

## 2022-06-23 LAB — GLUCOSE, CAPILLARY
Glucose-Capillary: 100 mg/dL — ABNORMAL HIGH (ref 70–99)
Glucose-Capillary: 88 mg/dL (ref 70–99)

## 2022-06-23 LAB — FERRITIN: Ferritin: 210 ng/mL (ref 24–336)

## 2022-06-23 LAB — BRAIN NATRIURETIC PEPTIDE: B Natriuretic Peptide: 39.8 pg/mL (ref 0.0–100.0)

## 2022-06-23 LAB — D-DIMER, QUANTITATIVE: D-Dimer, Quant: 0.51 ug/mL-FEU — ABNORMAL HIGH (ref 0.00–0.50)

## 2022-06-23 LAB — TSH: TSH: 0.708 u[IU]/mL (ref 0.350–4.500)

## 2022-06-23 MED ORDER — GUAIFENESIN 100 MG/5ML PO LIQD: 5.0000 mL | ORAL | Status: DC | PRN

## 2022-06-23 MED ORDER — METOPROLOL TARTRATE 5 MG/5ML IV SOLN: 5.0000 mg | INTRAVENOUS | Status: DC | PRN

## 2022-06-23 MED ORDER — INFLUENZA VAC A&B SA ADJ QUAD 0.5 ML IM PRSY
0.5000 mL | PREFILLED_SYRINGE | INTRAMUSCULAR | Status: DC
  Filled 2022-06-23: qty 0.5

## 2022-06-23 MED ORDER — SENNOSIDES-DOCUSATE SODIUM 8.6-50 MG PO TABS
1.0000 | ORAL_TABLET | Freq: Every evening | ORAL | Status: DC | PRN
  Administered 2022-06-24: 1 via ORAL
  Filled 2022-06-23: qty 1

## 2022-06-23 MED ORDER — INSULIN ASPART 100 UNIT/ML IJ SOLN: 0.0000 [IU] | Freq: Every day | INTRAMUSCULAR | Status: DC

## 2022-06-23 MED ORDER — IPRATROPIUM-ALBUTEROL 0.5-2.5 (3) MG/3ML IN SOLN: 3.0000 mL | RESPIRATORY_TRACT | Status: DC | PRN

## 2022-06-23 MED ORDER — INSULIN ASPART 100 UNIT/ML IJ SOLN
0.0000 [IU] | Freq: Three times a day (TID) | INTRAMUSCULAR | Status: DC
  Administered 2022-06-24: 3 [IU] via SUBCUTANEOUS
  Filled 2022-06-23: qty 1

## 2022-06-23 MED ORDER — IOHEXOL 350 MG/ML SOLN
75.0000 mL | Freq: Once | INTRAVENOUS | Status: AC | PRN
  Administered 2022-06-23: 75 mL via INTRAVENOUS

## 2022-06-23 MED ORDER — SODIUM CHLORIDE 0.9 % IV SOLN: INTRAVENOUS | Status: AC

## 2022-06-23 MED ORDER — LACTATED RINGERS IV BOLUS
1000.0000 mL | Freq: Once | INTRAVENOUS | Status: AC
  Administered 2022-06-23: 1000 mL via INTRAVENOUS

## 2022-06-23 MED ORDER — HYDRALAZINE HCL 20 MG/ML IJ SOLN: 10.0000 mg | INTRAMUSCULAR | Status: DC | PRN

## 2022-06-23 MED ORDER — TRAZODONE HCL 50 MG PO TABS: 50.0000 mg | ORAL_TABLET | Freq: Every evening | ORAL | Status: DC | PRN

## 2022-06-23 MED ORDER — PNEUMOCOCCAL 20-VAL CONJ VACC 0.5 ML IM SUSY: 0.5000 mL | PREFILLED_SYRINGE | INTRAMUSCULAR | Status: DC

## 2022-06-23 NOTE — ED Provider Notes (Signed)
[facility] Provider Note    Event Date/Time   First MD Initiated Contact with Patient 06/23/22 [phone]     (approximate)   History   Shortness of Breath and Dizziness   HPI  Glenn Barnett is a 71 y.o. male who comes in for his fourth visit in 5 days.  He has a history of diabetes and he complains of episodes of shortness of breath associated with a feeling of dizziness and feeling like he is going to fall.  Does not sound like vertigo when he describes it it does not really sound orthostatic today.      Physical Exam   Triage Vital Signs: ED Triage Vitals  Enc Vitals Group     BP 06/23/22 0729 (!) 135/114     Pulse Rate 06/23/22 0729 70     Resp 06/23/22 0729 18     Temp 06/23/22 0729 98.3 F (36.8 C)     Temp Source 06/23/22 0729 Oral     SpO2 06/23/22 0729 99 %     Weight --      Height --      Head Circumference --      Peak Flow --      Pain Score 06/23/22 0730 0     Pain Loc --      Pain Edu? --      Excl. in GC? --     Most recent vital signs: Vitals:   06/23/22 1100 06/23/22 1130  BP: (!) 127/57 (!) 155/74  Pulse: (!) 58 (!) 51  Resp: 14 14  Temp: 98.3 F (36.8 C)   SpO2: 100% 100%     General: Awake, no distress.  Eyes: Pupils equal round reactive extraocular movements intact Mouth no erythema or exudate CV:  Good peripheral perfusion.  Heart regular rate and rhythm no audible murmurs Resp:  Normal effort.  Lungs are clear Abd:  No distention.  Soft and nontender Extremities: No edema Neuro cranial nerves II through XII intact although visual fields were not checked cerebellar finger-nose heel-to-shin and rapid alternating movements and hands are normal motor strength is 5/5 throughout patient does not report any numbness   ED Results / Procedures / Treatments   Labs (all labs ordered are listed, but only abnormal results are displayed) Labs Reviewed  COMPREHENSIVE METABOLIC PANEL - Abnormal; Notable for the  following components:      Result Value   Potassium 3.4 (*)    Glucose, Bld 108 (*)    All other components within normal limits  LACTIC ACID, PLASMA - Abnormal; Notable for the following components:   Lactic Acid, Venous 2.0 (*)    All other components within normal limits  LACTIC ACID, PLASMA - Abnormal; Notable for the following components:   Lactic Acid, Venous 2.1 (*)    All other components within normal limits  D-DIMER, QUANTITATIVE - Abnormal; Notable for the following components:   D-Dimer, Quant 0.51 (*)    All other components within normal limits  URINALYSIS, ROUTINE W REFLEX MICROSCOPIC - Abnormal; Notable for the following components:   Color, Urine YELLOW (*)    APPearance CLEAR (*)    Specific Gravity, Urine >1.046 (*)    All other components within normal limits  CBC WITH DIFFERENTIAL/PLATELET  BRAIN NATRIURETIC PEPTIDE  FERRITIN  IRON AND TIBC  TSH  VITAMIN B12  LACTIC ACID, PLASMA  VITAMIN D 25 HYDROXY (VIT D DEFICIENCY, FRACTURES)  TROPONIN I (HIGH SENSITIVITY)  EKG  EKG read interpreted by me shows normal sinus rhythm rate of 74 normal axis no acute ST-T changes EKG looks similar to prior   RADIOLOGY Chest x-ray read by radiology reviewed and interpreted by me shows no acute disease 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  Radiology does not believe there is any new hemorrhage.  They note an aneurysm clip also. CT of the chest done to rule out PE since the patient has a minimally elevated the elevated D-dimer and intermittent shortness of breath really does not show anything but some bronchitis.  PROCEDURES:  Critical Care performed: Critical care time 25 minutes.  This includes reviewing the patient's old studies and labs and I spoke with neurosurgery about his subdural and the radiologist about his subdural and that I have spoken with the hospitalist.  Procedures   MEDICATIONS ORDERED IN  ED: Medications  ipratropium-albuterol (DUONEB) 0.5-2.5 (3) MG/3ML nebulizer solution 3 mL (has no administration in time range)  hydrALAZINE (APRESOLINE) injection 10 mg (has no administration in time range)  metoprolol tartrate (LOPRESSOR) injection 5 mg (has no administration in time range)  senna-docusate (Senokot-S) tablet 1 tablet (has no administration in time range)  guaiFENesin (ROBITUSSIN) 100 MG/5ML liquid 5 mL (has no administration in time range)  traZODone (DESYREL) tablet 50 mg (has no administration in time range)  insulin aspart (novoLOG) injection 0-15 Units (has no administration in time range)  insulin aspart (novoLOG) injection 0-5 Units (has no administration in time range)  0.9 %  sodium chloride infusion (has no administration in time range)  iohexol (OMNIPAQUE) 350 MG/ML injection 75 mL (75 mLs Intravenous Contrast Given 06/23/22 0827)     IMPRESSION / MDM / ASSESSMENT AND PLAN / ED COURSE  I reviewed the triage vital signs and the nursing notes. Patient is homeless living in the shelter.  His lactic acid is elevated he has 4 visits in the last 5 days for the same problem.  His urine looks somewhat concentrated with a specific gravity greater than 1.46 his serum lactate is up otherwise everything looks fairly decent.  I am not sure what is going on with him but I do not feel comfortable sending him home with his feeling of dizzy like he is going to fall.  He has apparently fallen since he has an old subdural that occurred since July when he had his last normal CT scan.  Differential diagnosis includes, but is not limited to, dehydration, lack of good p.o. intake of food, some repetitious alcohol consumption, occult infection are some of the possibilities.  Patient's presentation is most consistent with acute presentation with potential threat to life or bodily function.  The patient is on the cardiac monitor to evaluate for evidence of arrhythmia and/or significant  heart rate changes.  None have been seen      FINAL CLINICAL IMPRESSION(S) / ED DIAGNOSES   Final diagnoses:  Dizziness  Dyspnea, unspecified type  Elevated lactic acid level     Rx / DC Orders   ED Discharge Orders     None        Note:  This document was prepared using Dragon voice recognition software and may include unintentional dictation errors.   Arnaldo Natal, MD 06/23/22 1351

## 2022-06-23 NOTE — TOC Progression Note (Addendum)
Transition of Care (TOC) - Progression Note    Patient Details  Name: Glenn Barnett. MRN: [id number] Date of Birth: 1951-03-12  Transition of Care [facility]) CM/SW Contact  Marlowe Sax, RN Phone Number: 06/23/2022, 3:57 PM  Clinical Narrative:     The patient states he is living at a Homeless shelter,   he says he does has a car and gets a retirement income. He has been staying at [facility]. TOC saw him and gave him resources on 9/11 and was provided again today  He reports that  he has a daughter but she cannot let him live with her because "she has 5 kids."     Patient states that [employer] and his daughter are working to get him into a "sober living" house; he says he has not had alcohol in 30 years. But does want a place to live  He gets SSI $1900/month, he reports he currently  has no money, patient could not say what he spent all his money on.  The [facility] locally is 1200$ per month  Agency Name: [facility] Address: [address] [place], [place] [address] Phone: [phone] Email: [email or web address] Website: [email or web address] Service(s) Offered: Housing services, self-sufficiency, congregate meal  program, weatherization program, Field seismologist program, emergency food assistance,  housing counseling, home ownership program, wheels -towork program.   Agency Name: [facility] Address: [address] [place], [place] [address] Phone: [phone] (8a-4p) [phone] (8p- 10p) Email: [email or web address] Website: [email or web address] Service(s) Offered: A program for homeless and/or needy men that includes one-on-one counseling, life skills training and job rehabilitation.   Agency Name: [employer] [facility] [place] Address: [address] [place], [place] [address] Phone: [phone] Website: www.[email or web address].org Service(s) Offered:  Assistance to needy in emergency with utility bills, heating  fuel, and prescriptions. Shelter for homeless 7pm-7am. February 01, 2017 15   Agency Name: [facility] [place] [facility] Address: [address], [place], [place] [address] Phone: [phone] Contact Person: Cathleen Corti Email: [email or web address] Website: [email or web address] Service(s) Offered: Helps individuals with developmental disabilities move  from housing that is more restrictive to homes where they  can achieve greater independence and have more  opportunities.   Agency Name: [facility] Address: [address], [place], [place] [address] Phone: [phone] Email: [email or web address] Website: [email or web address].org Service(s) Offered: Provides affordable housing for low-income families,  elderly, and disabled individuals. Offer a wide range of  programs and services, from financial planning to afterschool and summer programs.   Agency Name: [facility] [employer] Address: [address] [place], [place] [address] Phone: [phone] Service(s) Offered: Child support services; child welfare services; food stamps;  Medicaid; work first family assistance; and aid with fuel,  rent, food and medicine.   Agency Name: [facility] [place] [employer]. Address: [facility] [address], [place], [place]  [address] Phone: [phone] Website: www.familyabuseservices.org Service(s) Offered: 24 hour Crisis Line: [phone]; 24 hour Emergency Shelter;  Transitional Housing; Support Groups; Scientist, physiological;  [employer]; Hispanic Outreach: [phone];  Visitation Center: [phone]. February 01, 2017 16   Agency Name: [facility] [place]. Address: [address] [place], [place] [address] Phone: [phone] Service(s) Offered: CAP Services; Home and [employer]; Individual  or Group Supports; Respite Care  Non-Institutional Nursing;  Residential Supports; Respite Care and Personal Care  Services; Transportation; Family and Friends Night;  Recreational Activities; Three Nutritious Meals/Snacks;  Consultation with Registered Dietician; Twenty-four hour  Registered Nurse Access; Daily and [employer]; Camp Green Leaves; [place] for the [employer] (During Summer Months) Bingo Night (Every  Wednesday Night); Special Populations Dance Night  (Every Tuesday Night); Professional Hair Care Services.   Agency Name: [facility] Address: [address], [place], [place] [address] Phone: [phone] Contact Person: Jabier Mutton Website: [email or web address].Physicist, medical) Offered: Residential treatment facility for women; food and  clothing, educational & employment development and  transportation to work; Counsellor of financial skills;  parenting and family reunification; emotional and spiritual  support; transitional housing for program graduates.   Agency Name: [facility] Address: [address] [place], [place] [address] Phone: [phone] Email: [email or web address] Website: [email or web address].es Service(s) Offered: Public housing units for elderly, disabled, and low income  people; housing choice vouchers for income eligible  applicants; shelter plus care vouchers; and [employer] program. February 01, 2017 17   Agency Name: [facility] Address: [address] [place], [place] [address] Phone: [phone] Email: [email or web address] Website: www.habitatalamance.org Service(s) Offered: Build houses for families in need of decent housing. Each  adult in the family must invest 200 hours of labor on  someone else's house, work with volunteers to build their  own house, attend classes on budgeting, home maintenance, yard care, and attend homeowner association  meetings.   Agency  Name: [facility] Address: [address] [place], [place] [address] Phone: [phone] Website: www.rsli.org Service(s) Offered: Intermediate care facilities for mentally retarded,  Supervised Living in group homes for adults with  developmental disabilities, Supervised Living for people  who have dual diagnoses (MRMI), Independent Living,  Supported Living, respite and a variety of CAP services,  pre-vocational services, day supports, and [employer].   Agency Name: [place] [employer] Phone: [phone] Website: www.NCForeclosurePrevention.gov Service(s) Offered: Zero-interest, deferred loans to homeowners struggling to  pay their mortgage. Call for more information     Expected Discharge Plan and Services                                                 Social Determinants of Health (SDOH) Interventions    Readmission Risk Interventions     No data to display

## 2022-06-23 NOTE — Discharge Instructions (Addendum)
Glenn Barnett is $ approx 1200$ per month including utilities and room as well as cable    Agency Name: [facility] Address: [address] [place], [place] [address] Phone: [phone] Email: [email or web address] Website: www.[email or web address].org Service(s) Offered: Housing services, self-sufficiency, congregate meal  program, weatherization program, Field seismologist program, emergency food assistance,  housing counseling, home ownership program, wheels -towork program.   Agency Name: [facility] Address: [address], [place], [place] [address] Phone: [phone] (8a-4p) [phone] (8p- 10p) Email: [email or web address] Website: [email or web address] Service(s) Offered: A program for homeless and/or needy men that includes one-on-one counseling, life skills training and job rehabilitation.   Agency Name: [employer] [facility] [place] Address: [address] [place], [place] [address] Phone: [phone] Website: www.[email or web address].org Service(s) Offered: Assistance to needy in emergency with utility bills, heating  fuel, and prescriptions. Shelter for homeless 7pm-7am. February 01, 2017 15   Agency Name: [facility] [place] (Developmentally Disabled) Address: [address], [place], [place] [address] Phone: [phone] Contact Person: Cathleen Corti Email: [email or web address] Website: [email or web address] Service(s) Offered: Helps individuals with developmental disabilities move  from housing that is more restrictive to homes where they  can achieve greater independence and have more  opportunities.   Agency Name: [facility] Address: [address], [place], [place] [address] Phone: [phone] Email: [email or web address] Website: www.[email or web address].org Service(s) Offered: Provides affordable housing for low-income families,  elderly, and disabled  individuals. Offer a wide range of  programs and services, from financial planning to afterschool and summer programs.   Agency Name: [facility] [employer] Services Address: [address] [place], [place] [address] Phone: [phone] Service(s) Offered: Child support services; child welfare services; food stamps;  Medicaid; work first family assistance; and aid with fuel,  rent, food and medicine.   Agency Name: [facility] [place] [employer]. Address: [facility] [address] [place], [place]  [address] Phone: [phone] Website: www.familyabuseservices.org Service(s) Offered: 24 hour Crisis Line: [phone]; 24 hour Emergency Shelter;  Transitional Housing; Support Groups; Scientist, physiological;  [employer]; Hispanic Outreach: [phone];  Visitation Center: [phone]. February 01, 2017 16   Agency Name: [facility] [place]. Address: [address] [place], [place] [address] Phone: [phone] Service(s) Offered: CAP Services; Home and [employer]; Individual  or Group Supports; Respite Care Non-Institutional Nursing;  Residential Supports; Respite Care and Personal Care  Services; Transportation; Family and Friends Night;  Recreational Activities; Three Nutritious Meals/Snacks;  Consultation with Registered Dietician; Twenty-four hour  Registered Nurse Access; Daily and [employer]; Camp Green Leaves; [place] for the [employer] (During Summer Months) Bingo Night (Every  Wednesday Night); Special Populations Dance Night  (Every Tuesday Night); Professional Hair Care Services.   Agency Name: [facility] Address: [address], [place], [place] [address] Phone: [phone] Contact Person: Jabier Mutton Website: [email or web address].Physicist, medical) Offered: Residential treatment facility for women; food and  clothing, educational & employment development and   transportation to work; Counsellor of financial skills;  parenting and family reunification; emotional and spiritual  support; transitional housing for program graduates.   Agency Name: [facility] Address: [address], [place], [place] [address] Phone: [phone] Email: [email or web address] Website: TaskTown.es Service(s) Offered: Public housing units for elderly, disabled, and low income  people; housing choice vouchers for income eligible  applicants; shelter plus care vouchers; and [employer] program. February 01, 2017 17   Agency Name: [facility]  Address: [address], [place], [place] [address] Phone: [phone] Email: [email or web address] Website: www.habitatalamance.org Service(s) Offered: Build houses for families in need of decent housing. Each  adult in the family must invest 200 hours of labor on  someone else's house, work with volunteers to build their  own house, attend classes on budgeting, home maintenance, yard care, and attend homeowner association  meetings.   Agency Name: [facility] Address: [address], [place], [place] [address] Phone: [phone] Website: www.rsli.org Service(s) Offered: Intermediate care facilities for mentally retarded,  Supervised Living in group homes for adults with  developmental disabilities, Supervised Living for people  who have dual diagnoses (MRMI), Independent Living,  Supported Living, respite and a variety of CAP services,  pre-vocational services, day supports, and [employer].   Agency Name: [place] [employer] Phone: [phone] Website: www.NCForeclosurePrevention.gov Service(s) Offered: Zero-interest, deferred loans to homeowners struggling to  pay their mortgage. Call for more information

## 2022-06-23 NOTE — Progress Notes (Signed)
History and Physical    Glenn Barnett OFB:[id number] DOB: [birth year] DOA: 06/23/2022  PCP: Pcp, No Patient coming from: Shelter  Chief Complaint: Dizziness  HPI: Glenn Barnett is a 71 y.o. male with medical history significant of DM2 comes to the hospital with complaints of dizziness.  Patient states for the past 3-4 days he has had dizziness and multiple falls therefore came to the ED for evaluation.  He has been to the ED about 3-4 times in last 4 days due to similar complaints.  Couple of times her suspicion for dehydration requiring IV fluids.  Denies any other complaints.  He lives in a shelter therefore does not take his diabetic medications consistently and has had frequently fluctuating blood glucose. In the ED his routine blood work was unremarkable.  CT of the head showed 5 mm subdural hematoma without midline shift.  He also had elevated D-dimer therefore CTA chest was done which was negative for PE but showed chronic COPD changes.  Had mild lactic acidosis for which received IV fluids.  Social history-denies any alcohol, tobacco and illicit drug use.  But I did note at bedside there were some cigarettes and lighter laying around. CODE STATUS-full   Review of Systems: As per HPI otherwise 10 point review of systems negative.  Review of Systems Otherwise negative except as per HPI, including: General: Denies fever, chills, night sweats or unintended weight loss. Resp: Denies cough, wheezing, shortness of breath. Cardiac: Denies chest pain, palpitations, orthopnea, paroxysmal nocturnal dyspnea. GI: Denies abdominal pain, nausea, vomiting, diarrhea or constipation GU: Denies dysuria, frequency, hesitancy or incontinence MS: Denies muscle aches, joint pain or swelling Neuro: Denies headache, neurologic deficits (focal weakness, numbness, tingling), abnormal gait Psych: Denies anxiety, depression, SI/HI/AVH Skin: Denies new rashes or lesions ID: Denies sick contacts, exotic  exposures, travel  Past Medical History:  Diagnosis Date   Brain aneurysm    Diabetes mellitus without complication (HCC)     History reviewed. No pertinent surgical history.  SOCIAL HISTORY:  reports that he has never smoked. He has never used smokeless tobacco. He reports that he does not currently use alcohol. He reports that he does not currently use drugs.  Allergies  Allergen Reactions   Other Other (See Comments)    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    FAMILY HISTORY: History reviewed. No pertinent family history.   Prior to Admission medications   Medication Sig Start Date End Date Taking? Authorizing Provider  metFORMIN (GLUCOPHAGE) 500 MG tablet Take 1 tablet (500 mg total) by mouth 2 (two) times daily with a meal. 06/21/22 09/19/22  Merwyn Katos, MD    Physical Exam: Vitals:   06/23/22 0746 06/23/22 1050 06/23/22 1100 06/23/22 1130  BP: (!) 141/86 (!) 129/56 (!) 127/57 (!) 155/74  Pulse: 88 61 (!) 58 (!) 51  Resp: 18 14 14 14   Temp:   98.3 F (36.8 C)   TempSrc:   Oral   SpO2: 100% 98% 100% 100%      Constitutional: NAD, calm, comfortable Eyes: PERRL, lids and conjunctivae normal ENMT: Mucous membranes are moist. Posterior pharynx clear of any exudate or lesions.Normal dentition.  Neck: normal, supple, no masses, no thyromegaly Respiratory: clear to auscultation bilaterally, no wheezing, no crackles. Normal respiratory effort. No accessory muscle use.  Cardiovascular: Regular rate and rhythm, no murmurs / rubs / gallops. No extremity edema. 2+ pedal pulses. No carotid bruits.  Abdomen: no tenderness, no masses palpated. No  hepatosplenomegaly. Bowel sounds positive.  Musculoskeletal: no clubbing / cyanosis. No joint deformity upper and lower extremities. Good ROM, no contractures. Normal muscle tone.  Skin: no rashes, lesions, ulcers. No induration Neurologic: CN 2-12 grossly intact. Sensation intact, DTR normal. Strength 5/5  in all 4.  Psychiatric: Normal judgment and insight. Alert and oriented x 3. Normal mood.     Labs on Admission: I have personally reviewed following labs and imaging studies  CBC: Recent Labs  Lab 06/19/22 0721 06/21/22 0745 06/23/22 0738  WBC 10.4 10.6* 9.4  NEUTROABS  --  8.5* 7.0  HGB 17.1* 16.6 16.5  HCT 52.8* 50.4 50.9  MCV 88.0 89.8 88.1  PLT 321 328 346   Basic Metabolic Panel: Recent Labs  Lab 06/19/22 0721 06/21/22 0745 06/23/22 0738  NA 138 140 142  K 3.7 3.2* 3.4*  CL 106 108 107  CO2 22 24 26   GLUCOSE 190* 159* 108*  BUN 16 16 12   CREATININE 1.26* 1.17 1.14  CALCIUM 9.0 8.9 9.3   GFR: Estimated Creatinine Clearance: 71.7 mL/min (by C-G formula based on SCr of 1.14 mg/dL). Liver Function Tests: Recent Labs  Lab 06/21/22 0745 06/23/22 0738  AST 18 20  ALT 13 11  ALKPHOS 59 64  BILITOT 1.3* 1.2  PROT 6.7 6.8  ALBUMIN 3.5 3.6   No results for input(s): "LIPASE", "AMYLASE" in the last 168 hours. No results for input(s): "AMMONIA" in the last 168 hours. Coagulation Profile: No results for input(s): "INR", "PROTIME" in the last 168 hours. Cardiac Enzymes: No results for input(s): "CKTOTAL", "CKMB", "CKMBINDEX", "TROPONINI" in the last 168 hours. BNP (last 3 results) No results for input(s): "PROBNP" in the last 8760 hours. HbA1C: No results for input(s): "HGBA1C" in the last 72 hours. CBG: Recent Labs  Lab 06/22/22 0811  GLUCAP 97   Lipid Profile: No results for input(s): "CHOL", "HDL", "LDLCALC", "TRIG", "CHOLHDL", "LDLDIRECT" in the last 72 hours. Thyroid Function Tests: No results for input(s): "TSH", "T4TOTAL", "FREET4", "T3FREE", "THYROIDAB" in the last 72 hours. Anemia Panel: No results for input(s): "VITAMINB12", "FOLATE", "FERRITIN", "TIBC", "IRON", "RETICCTPCT" in the last 72 hours. Urine analysis:    Component Value Date/Time   COLORURINE YELLOW (A) 06/23/2022 0751   APPEARANCEUR CLEAR (A) 06/23/2022 0751   LABSPEC >1.046 (H)  06/23/2022 0751   PHURINE 6.0 06/23/2022 0751   GLUCOSEU NEGATIVE 06/23/2022 0751   HGBUR NEGATIVE 06/23/2022 0751   BILIRUBINUR NEGATIVE 06/23/2022 0751   KETONESUR NEGATIVE 06/23/2022 0751   PROTEINUR NEGATIVE 06/23/2022 0751   NITRITE NEGATIVE 06/23/2022 0751   LEUKOCYTESUR NEGATIVE 06/23/2022 0751   Sepsis Labs: !!!!!!!!!!!!!!!!!!!!!!!!!!!!!!!!!!!!!!!!!!!! @LABRCNTIP (procalcitonin:4,lacticidven:4) ) Recent Results (from the past 240 hour(s))  SARS Coronavirus 2 by RT PCR (hospital order, performed in [facility] hospital lab) *cepheid single result test* Anterior Nasal Swab     Status: None   Collection Time: 06/19/22  7:45 AM   Specimen: Anterior Nasal Swab  Result Value Ref Range Status   SARS Coronavirus 2 by RT PCR NEGATIVE NEGATIVE Final    Comment: (NOTE) SARS-CoV-2 target nucleic acids are NOT DETECTED.  The SARS-CoV-2 RNA is generally detectable in upper and lower respiratory specimens during the acute phase of infection. The lowest concentration of SARS-CoV-2 viral copies this assay can detect is 250 copies / mL. A negative result does not preclude SARS-CoV-2 infection and should not be used as the sole basis for treatment or other patient management decisions.  A negative result may occur with improper specimen collection / handling,  submission of specimen other than nasopharyngeal swab, presence of viral mutation(s) within the areas targeted by this assay, and inadequate number of viral copies (<250 copies / mL). A negative result must be combined with clinical observations, patient history, and epidemiological information.  Fact Sheet for Patients:   RoadLapTop.co.za  Fact Sheet for Healthcare Providers: http://kim-miller.com/  This test is not yet approved or  cleared by the [place] FDA and has been authorized for detection and/or diagnosis of SARS-CoV-2 by FDA under an Emergency Use Authorization (EUA).   This EUA will remain in effect (meaning this test can be used) for the duration of the COVID-19 declaration under Section 564(b)(1) of the Act, 21 U.S.C. section 360bbb-3(b)(1), unless the authorization is terminated or revoked sooner.  Performed at [facility], [address] [place], [place] [address]      Radiological Exams on Admission: CT Angio Chest PE W and/or Wo Contrast  Result Date: 06/23/2022 CLINICAL DATA:  Suspected pulmonary embolism in a 71 year old male who presents with lightheadedness and shortness of breath EXAM: CT ANGIOGRAPHY CHEST WITH CONTRAST TECHNIQUE: Multidetector CT imaging of the chest was performed using the standard protocol during bolus administration of intravenous contrast. Multiplanar CT image reconstructions and MIPs were obtained to evaluate the vascular anatomy. RADIATION DOSE REDUCTION: This exam was performed according to the departmental dose-optimization program which includes automated exposure control, adjustment of the mA and/or kV according to patient size and/or use of iterative reconstruction technique. CONTRAST:  43mL OMNIPAQUE IOHEXOL 350 MG/ML SOLN COMPARISON:  Only chest radiograph surveil a bulbar for comparison. FINDINGS: Cardiovascular: Pulmonary arteries are opacified to 557 Hounsfield units. Study is negative for pulmonary embolism. Heart size is normal. No pericardial effusion or signs of pericardial nodularity. Scattered three-vessel coronary artery calcifications. Aorta is normal caliber and not well evaluated due to bolus timing which is optimized for pulmonary arterial evaluation. Mediastinum/Nodes: Patulous esophagus. The no sign of adenopathy in the chest. Lungs/Pleura: No pneumothorax. No sign of pleural effusion. No lobar consolidative process. Mild bronchial wall thickening. Airways are patent. Upper Abdomen: Incidental imaging of upper abdominal contents without acute process. Small hepatic cysts. Cholelithiasis. Incomplete  imaging of upper abdominal structures. Musculoskeletal: No acute bone finding. No destructive bone process. Spinal degenerative changes. Review of the MIP images confirms the above findings. IMPRESSION: 1. Negative for pulmonary embolism. 2. Mild bronchial wall thickening, correlate with any symptoms of bronchitis/COPD. 3. Scattered three-vessel coronary artery calcifications. 4. Cholelithiasis. Electronically Signed   By: Donzetta Kohut M.D.   On: 06/23/2022 08:42   CT Head Wo Contrast  Result Date: 06/23/2022 CLINICAL DATA:  Dizziness, persistent/recurrent, cardiac or vascular cause suspected EXAM: CT HEAD WITHOUT CONTRAST TECHNIQUE: Contiguous axial images were obtained from the base of the skull through the vertex without intravenous contrast. RADIATION DOSE REDUCTION: This exam was performed according to the departmental dose-optimization program which includes automated exposure control, adjustment of the mA and/or kV according to patient size and/or use of iterative reconstruction technique. COMPARISON:  CT dated April 28, 2022, March 20, 2021 FINDINGS: Brain: There is an isodense extra-axial fluid collection overlying the RIGHT frontoparietal lobe which spans approximately 5 mm, new in comparison to priors (series 4, image 46). No significant midline shift. No evidence of acute infarction, intraparenchymal hemorrhage or new hydrocephalus. Unchanged encephalomalacia of the RIGHT frontal lobe. Vascular: Aneurysm clip. Skull: Status post RIGHT frontal craniotomy. Sinuses/Orbits: Scattered opacification of the sinuses, most predominant throughout the ethmoid air cells and the RIGHT frontal sinuses. Other: None.  IMPRESSION: Chronic appearing approximately 5 mm subdural hematoma overlying the RIGHT frontoparietal lobe, new in comparison to prior. No significant midline shift. These results were called by telephone at the time of interpretation on 06/23/2022 at 8:18 am to provider [facility] , who verbally  acknowledged these results. Electronically Signed   By: Meda Klinefelter M.D.   On: 06/23/2022 08:21   DG Chest Portable 1 View  Result Date: 06/23/2022 CLINICAL DATA:  sob EXAM: PORTABLE CHEST 1 VIEW COMPARISON:  April 28, 2022 FINDINGS: The cardiomediastinal silhouette is unchanged in contour.Tortuous thoracic aorta. No pleural effusion. No pneumothorax. No acute pleuroparenchymal abnormality. Visualized abdomen is unremarkable. IMPRESSION: No acute cardiopulmonary abnormality. Electronically Signed   By: Meda Klinefelter M.D.   On: 06/23/2022 08:06     All images have been reviewed by me personally.  EKG: Independently reviewed.  No acute ST-T changes  Assessment/Plan Principal Problem:   Dizziness Active Problems:   Depression   Subdural hematoma (HCC)    Dizziness and unsteady gait Subdural hematoma 5 mm.  No midline shift -Admit patient to the hospital.  He has frequently been visiting the ED for similar issues.  CT head shows 5 mm subdural hematoma without midline shift.  We will monitor him, neurochecks.  Not on any anticoagulation at home. -D-dimer mildly elevated but CTA chest negative for PE.  COPD changes but he denies smoking cigarettes. - Check B12, folate, TSH, echocardiogram, carotid Dopplers - PT/OT.  Diabetes mellitus type 2 - Tells me he lives in a shelter therefore his sugars have been fluctuating a lot.  Will check A1c.  Hold off on home antidiabetic regimen. - Accu-Cheks and sliding scale   DVT prophylaxis: SCDs Code Status: Full code Family Communication: None Consults called: ED spoke with neurosurgery.  No further intervention. Admission status:   Status is: Observation The patient remains OBS appropriate and will d/c before 2 midnights.   Time Spent: 65 minutes.  >50% of the time was devoted to discussing the patients care, assessment, plan and disposition with other care givers along with counseling the patient about the risks and benefits of  treatment.    Ajay Strubel Joline Maxcy MD [facility]  If 7PM-7AM, please contact night-coverage   06/23/2022, 12:29 PM

## 2022-06-23 NOTE — Consult Note (Signed)
Neurosurgery-New Consultation Evaluation 06/23/2022 Jerrico Covello [id number]  Identifying Statement: Davone Shinault. is a 71 y.o. male from [place] [id number] with dizziness  Physician Requesting Consultation: Dr Darnelle Catalan, ED  History of Present Illness: Mr Knupp is here for evaluation of ongoing dizziness and balance difficulty. He has a knwn aneurysm surgery history many years ago and therefore a CT of the head was obtained with concern for small amount of extra-axial fluid. He denies any current speech problems, weakness, or numbness. He has not had any seizure activity. He is not on antiplatelet or anticoagulant therapy. Given this CT finding, neurosurgery is consulted.   Past Medical History:  Past Medical History:  Diagnosis Date   Brain aneurysm    Diabetes mellitus without complication (HCC)     Social History: Social History   Socioeconomic History   Marital status: Divorced    Spouse name: Not on file   Number of children: Not on file   Years of education: Not on file   Highest education level: Not on file  Occupational History   Not on file  Tobacco Use   Smoking status: Never   Smokeless tobacco: Never  Substance and Sexual Activity   Alcohol use: Not Currently   Drug use: Not Currently   Sexual activity: Not on file  Other Topics Concern   Not on file  Social History Narrative   Not on file   Social Determinants of Health   Financial Resource Strain: Not on file  Food Insecurity: Not on file  Transportation Needs: Not on file  Physical Activity: Not on file  Stress: Not on file  Social Connections: Not on file  Intimate Partner Violence: Not on file     Family History: History reviewed. No pertinent family history.  Review of Systems:  Review of Systems - General ROS: Negative Psychological ROS: Negative Ophthalmic ROS: Negative ENT ROS: Negative Hematological and Lymphatic ROS: Negative  Endocrine ROS: Negative Respiratory ROS:  Negative Cardiovascular ROS: Negative Gastrointestinal ROS: Negative Genito-Urinary ROS: Negative Musculoskeletal ROS: Negative Neurological ROS: Positive for dizziness and balance difficulty Dermatological ROS: Negative  Physical Exam: BP (!) 158/78 (BP Location: Left Arm)   Pulse 64   Temp 98.3 F (36.8 C)   Resp 17   SpO2 99%  There is no height or weight on file to calculate BMI. There is no height or weight on file to calculate BSA. General appearance: Alert, cooperative, in no acute distress Head: Normocephalic, atraumatic Eyes: Normal, EOM intact Oropharynx: Moist without lesions Neck: Supple, no tenderness Ext: No edema in LE bilaterally  Neurologic exam:  Mental status: alertness: alert, orientation: person, place, time, affect: normal Speech: fluent and clear names and rpeats Cranial nerves:  III/IV/VI: extra-ocular motions intact bilaterally V/VII:no evidence of facial droop or weakness  VIII: hearing normal XI: trapezius strength symmetric,  sternocleidomastoid strength symmetric Motor:strength symmetric 5/5, normal muscle mass and tone in all extremities and no pronator drift Sensory: intact to light touch in all extremities Gait: not tested  Laboratory: Results for orders placed or performed during the hospital encounter of 06/23/22  Comprehensive metabolic panel  Result Value Ref Range   Sodium 142 135 - 145 mmol/L   Potassium 3.4 (L) 3.5 - 5.1 mmol/L   Chloride 107 98 - 111 mmol/L   CO2 26 22 - 32 mmol/L   Glucose, Bld 108 (H) 70 - 99 mg/dL   BUN 12 8 - 23 mg/dL   Creatinine, Ser 4.03 0.61 - 1.24 mg/dL  Calcium 9.3 8.9 - 10.3 mg/dL   Total Protein 6.8 6.5 - 8.1 g/dL   Albumin 3.6 3.5 - 5.0 g/dL   AST 20 15 - 41 U/L   ALT 11 0 - 44 U/L   Alkaline Phosphatase 64 38 - 126 U/L   Total Bilirubin 1.2 0.3 - 1.2 mg/dL   GFR, Estimated >84 >66 mL/min   Anion gap 9 5 - 15  Lactic acid, plasma  Result Value Ref Range   Lactic Acid, Venous 2.0 (HH) 0.5 -  1.9 mmol/L  Lactic acid, plasma  Result Value Ref Range   Lactic Acid, Venous 2.1 (HH) 0.5 - 1.9 mmol/L  CBC with Differential  Result Value Ref Range   WBC 9.4 4.0 - 10.5 K/uL   RBC 5.78 4.22 - 5.81 MIL/uL   Hemoglobin 16.5 13.0 - 17.0 g/dL   HCT 59.9 35.7 - 01.7 %   MCV 88.1 80.0 - 100.0 fL   MCH 28.5 26.0 - 34.0 pg   MCHC 32.4 30.0 - 36.0 g/dL   RDW 79.3 90.3 - 00.9 %   Platelets 346 150 - 400 K/uL   nRBC 0.0 0.0 - 0.2 %   Neutrophils Relative % 74 %   Neutro Abs 7.0 1.7 - 7.7 K/uL   Lymphocytes Relative 17 %   Lymphs Abs 1.6 0.7 - 4.0 K/uL   Monocytes Relative 8 %   Monocytes Absolute 0.7 0.1 - 1.0 K/uL   Eosinophils Relative 1 %   Eosinophils Absolute 0.1 0.0 - 0.5 K/uL   Basophils Relative 0 %   Basophils Absolute 0.0 0.0 - 0.1 K/uL   Immature Granulocytes 0 %   Abs Immature Granulocytes 0.03 0.00 - 0.07 K/uL  D-dimer, quantitative  Result Value Ref Range   D-Dimer, Quant 0.51 (H) 0.00 - 0.50 ug/mL-FEU  Urinalysis, Routine w reflex microscopic  Result Value Ref Range   Color, Urine YELLOW (A) YELLOW   APPearance CLEAR (A) CLEAR   Specific Gravity, Urine >1.046 (H) 1.005 - 1.030   pH 6.0 5.0 - 8.0   Glucose, UA NEGATIVE NEGATIVE mg/dL   Hgb urine dipstick NEGATIVE NEGATIVE   Bilirubin Urine NEGATIVE NEGATIVE   Ketones, ur NEGATIVE NEGATIVE mg/dL   Protein, ur NEGATIVE NEGATIVE mg/dL   Nitrite NEGATIVE NEGATIVE   Leukocytes,Ua NEGATIVE NEGATIVE  Brain natriuretic peptide  Result Value Ref Range   B Natriuretic Peptide 39.8 0.0 - 100.0 pg/mL  Ferritin  Result Value Ref Range   Ferritin 210 24 - 336 ng/mL  Iron and TIBC  Result Value Ref Range   Iron 49 45 - 182 ug/dL   TIBC 233 (L) 007 - 622 ug/dL   Saturation Ratios 20 17.9 - 39.5 %   UIBC 195 ug/dL  TSH  Result Value Ref Range   TSH 0.708 0.350 - 4.500 uIU/mL  Vitamin B12  Result Value Ref Range   Vitamin B-12 125 (L) 180 - 914 pg/mL  VITAMIN D 25 Hydroxy (Vit-D Deficiency, Fractures)  Result Value  Ref Range   Vit D, 25-Hydroxy 17.16 (L) 30 - 100 ng/mL  Glucose, capillary  Result Value Ref Range   Glucose-Capillary 100 (H) 70 - 99 mg/dL  Glucose, capillary  Result Value Ref Range   Glucose-Capillary 88 70 - 99 mg/dL   Comment 1 Notify RN   Troponin I (High Sensitivity)  Result Value Ref Range   Troponin I (High Sensitivity) 5 <18 ng/L   I personally reviewed labs  Imaging: CT Head: Chronic appearing  approximately 5 mm subdural hematoma overlying the RIGHT frontoparietal lobe, new in comparison to prior. No significant midline shift.  Impression/Plan:  Mr Frisco is here for evaluation of dizziness and CT showing possible small chronic SDH. Given the small nature and chronicity, no intervention is needed. This is not the cause of his current symptoms and he would benefit from further medical evaluation. If patient develops any new focal neurologic symptoms, can repeat a CT head at that time. Otherwise, no follow up needed for current findings   1.  Diagnosis: Small chronic SDH  2.  Plan - No intervention recommended

## 2022-06-23 NOTE — Evaluation (Signed)
Physical Therapy Evaluation Patient Details Name: Glenn Barnett. MRN: [id number] DOB: 1951-07-31 Today's Date: 06/23/2022  History of Present Illness  Glenn Barnett is a 71yoM who comes to [facility] on 9/13 CC orthostatic dizziness, progressive resting giddiness. PMH: DM2 on metformin. BG240s c EMS. CT of the head showed 5 mm subdural hematoma without midline shift.  He also had elevated D-dimer therefore CTA chest was done which was negative for PE but showed chronic COPD changes.  Had mild lactic acidosis for which received IV fluids. Pt lives at local homeless shelter, recently moved here to be closer with DTR and grandchild, previously living in [place], [place]. At baseline pt has genralized dizziness when sitting up in bed, well managed with modified movement patterns, typical is unstead onfeet for no particular reason, no recent falls or head trauma per pt, does not use any device for mobility.  Clinical Impression  Pt seen in ED, preparing to move to floor for admission. Reports continued generalized feelings of unsteadiness that is not consistent with true dizziness. Pt performs all mobility with confidence and effort, but requires no physical assistance, shows no LOB. Pt reports long time orthostatic intolerance but moreso with supine to sitting rather than with sitting to standing, may indicated incidental vestibular component. Unable to obtain any report to correlate CT findings of SDH. Pt has orthostatic dip from sitting to standing that is improved upon 2nd standing BP assessment, asymptomatic. Pt able to march in place and sidestep along the bed without any frank unsteadiness or perceived LOB. Pt will benefit from skilled PT intervention to increase independence and safety with basic mobility in preparation for discharge to the venue listed below.     Orthostatic VS for the past 24 hrs (Last 3 readings):  BP- Lying Pulse- Lying BP- Sitting Pulse- Sitting BP- Standing at 0 minutes Pulse-  Standing at 0 minutes BP- Standing at 3 minutes Pulse- Standing at 3 minutes  06/23/22 1439 115/81 67 113/79 69 106/83 93 124/83 96  06/23/22 0742 135/88 70 151/83 76 141/86 90 -- --        Recommendations for follow up therapy are one component of a multi-disciplinary discharge planning process, led by the attending physician.  Recommendations may be updated based on patient status, additional functional criteria and insurance authorization.  Follow Up Recommendations No PT follow up      Assistance Recommended at Discharge PRN  Patient can return home with the following  Direct supervision/assist for medications management    Equipment Recommendations Cane  Recommendations for Other Services       Functional Status Assessment Patient has had a recent decline in their functional status and demonstrates the ability to make significant improvements in function in a reasonable and predictable amount of time.     Precautions / Restrictions Precautions Precautions: Fall Restrictions Weight Bearing Restrictions: No      Mobility  Bed Mobility Overal bed mobility: Independent                  Transfers Overall transfer level: Independent Equipment used: None                    Ambulation/Gait Ambulation/Gait assistance:  (unable for time, but is able to take side steps at EOB and march in place without LOB or falls anxiety)                Stairs  Wheelchair Mobility    Modified Rankin (Stroke Patients Only)       Balance Overall balance assessment: Independent                                           Pertinent Vitals/Pain Pain Assessment Pain Assessment: No/denies pain    Home Living Family/patient expects to be discharged to:: Shelter/Homeless                   Additional Comments: several weeks at local homeless shelter    Prior Function Prior Level of Function : Independent/Modified  Independent             Mobility Comments: no device needed or used, but reports chronic intermittent intensity of dizziness/lightheadedness ADLs Comments: independent     Hand Dominance        Extremity/Trunk Assessment   Upper Extremity Assessment Upper Extremity Assessment: Overall WFL for tasks assessed    Lower Extremity Assessment Lower Extremity Assessment: Overall WFL for tasks assessed       Communication      Cognition Arousal/Alertness: Awake/alert Behavior During Therapy: WFL for tasks assessed/performed Overall Cognitive Status: Within Functional Limits for tasks assessed                                          General Comments      Exercises     Assessment/Plan    PT Assessment Patient needs continued PT services  PT Problem List Decreased balance;Decreased mobility;Decreased activity tolerance;Decreased range of motion;Decreased strength;Decreased cognition;Decreased knowledge of use of DME;Decreased knowledge of precautions       PT Treatment Interventions DME instruction;Balance training;Gait training;Stair training;Functional mobility training;Therapeutic activities;Therapeutic exercise;Patient/family education    PT Goals (Current goals can be found in the Care Plan section)  Acute Rehab PT Goals Patient Stated Goal: feel secure on feet PT Goal Formulation: With patient Time For Goal Achievement: 07/07/22 Potential to Achieve Goals: Good    Frequency Min 2X/week     Co-evaluation               AM-PAC PT "6 Clicks" Mobility  Outcome Measure Help needed turning from your back to your side while in a flat bed without using bedrails?: None Help needed moving from lying on your back to sitting on the side of a flat bed without using bedrails?: None Help needed moving to and from a bed to a chair (including a wheelchair)?: None Help needed standing up from a chair using your arms (e.g., wheelchair or bedside  chair)?: None Help needed to walk in hospital room?: A Little Help needed climbing 3-5 steps with a railing? : A Little 6 Click Score: 22    End of Session   Activity Tolerance: Patient tolerated treatment well;No increased pain Patient left: in bed;with nursing/sitter in room   PT Visit Diagnosis: Unsteadiness on feet (R26.81);Other abnormalities of gait and mobility (R26.89)    Time: 0865-7846 PT Time Calculation (min) (ACUTE ONLY): 17 min   Charges:   PT Evaluation $PT Eval Low Complexity: 1 Low         3:05 PM, 06/23/22 Rosamaria Lints, PT, DPT Physical Therapist - [facility]  [phone] (ASCOM)   Kristofor Michalowski C 06/23/2022, 3:01 PM

## 2022-06-23 NOTE — H&P (Signed)
History and physical completed. Labelled as Progress note on 06/23/22 at 12:29pm.

## 2022-06-23 NOTE — Evaluation (Signed)
Occupational Therapy Evaluation Patient Details Name: Glenn Barnett. MRN: [id number] DOB: May 17, 1951 Today's Date: 06/23/2022   History of Present Illness Glenn Meir" Barnett is a 71yoM who comes to [facility] on 9/13 CC orthostatic dizziness, progressive resting giddiness. PMH: DM2 on metformin. BG240s c EMS. CT of the head showed 5 mm subdural hematoma without midline shift.  He also had elevated D-dimer therefore CTA chest was done which was negative for PE but showed chronic COPD changes.  Had mild lactic acidosis for which received IV fluids. Pt lives at local homeless shelter, recently moved here to be closer with DTR and grandchild, previously living in [place], [place]. At baseline pt has genralized dizziness when sitting up in bed, well managed with modified movement patterns, typical is unstead onfeet for no particular reason, no recent falls or head trauma per pt, does not use any device for mobility.   Clinical Impression   Mr. [name] presents with generalized weakness, reduced endurance, impaired balance, and poor health literacy. He is Mod I-SUPV for bed mobility, UB and LB dressing, grooming, toileting, ambulating without AD but demonstrating several mild LOB episodes and frequently reaching out to walls to stabilize self while walking. He reports mild dizziness, especially when coming from supine to sitting, and tires quickly w/ mobility. Pt reports currently living at a homeless shelter in [place]. He owns a car but is not driving at present as he does not have money for gas. He reports being out of his DM medication, that he does not know how to monitor his glucose levels, that he has sleep apnea and very poor sleep but does not know what he could do to address this issue. He has recently moved to the area, does not have a primary care physician, and states that he has no idea how to connect with a health care provider. He reports he does not remember to drink water or other fluids  while acknowledging that dehydration may be a frequent concern. Provided educ re: medication mgmt, obtaining access to health care services, slow transitions when making postural changes. Pt verbalizes understand but would benefit from additional education as well as rehab services to address deficits in strength, endurance, and balance. Will continue to offer OT during hospitalization.    Recommendations for follow up therapy are one component of a multi-disciplinary discharge planning process, led by the attending physician.  Recommendations may be updated based on patient status, additional functional criteria and insurance authorization.   Follow Up Recommendations  No OT follow up    Assistance Recommended at Discharge PRN  Patient can return home with the following Assistance with cooking/housework;Direct supervision/assist for medications management    Functional Status Assessment  Patient has had a recent decline in their functional status and demonstrates the ability to make significant improvements in function in a reasonable and predictable amount of time.  Equipment Recommendations  None recommended by OT    Recommendations for Other Services       Precautions / Restrictions Precautions Precautions: Fall Restrictions Weight Bearing Restrictions: No      Mobility Bed Mobility Overal bed mobility: Independent                  Transfers Overall transfer level: Independent Equipment used: None                      Balance Overall balance assessment: Independent Sitting-balance support: No upper extremity supported Sitting balance-Leahy Scale: Good  Standing balance support: Single extremity supported, No upper extremity supported Standing balance-Leahy Scale: Fair Standing balance comment: frequently reaches out for wall to stablize while walking                           ADL either performed or assessed with clinical judgement    ADL Overall ADL's : Needs assistance/impaired     Grooming: Wash/dry hands;Standing;Supervision/safety               Lower Body Dressing: Modified independent;Sitting/lateral leans Lower Body Dressing Details (indicate cue type and reason): donning/doffing socks Toilet Transfer: Supervision/safety;Regular Social worker and Hygiene: Modified independent;Sitting/lateral lean               Vision Patient Visual Report: No change from baseline       Perception     Praxis      Pertinent Vitals/Pain Pain Assessment Pain Assessment: No/denies pain     Hand Dominance     Extremity/Trunk Assessment Upper Extremity Assessment Upper Extremity Assessment: Overall WFL for tasks assessed   Lower Extremity Assessment Lower Extremity Assessment: Overall WFL for tasks assessed       Communication Communication Communication: No difficulties   Cognition Arousal/Alertness: Awake/alert Behavior During Therapy: WFL for tasks assessed/performed Overall Cognitive Status: Within Functional Limits for tasks assessed                                       General Comments       Exercises Other Exercises Other Exercises: Educ re: importance of sleep, avoiding dehydration, DM management   Shoulder Instructions      Home Living Family/patient expects to be discharged to:: Unsure                                 Additional Comments: for past several weeks has been living at homeless shelter in [facility]      Prior Functioning/Environment Prior Level of Function : Independent/Modified Independent             Mobility Comments: no device needed or used, but reports chronic intermittent intensity of dizziness/lightheadedness ADLs Comments: independent        OT Problem List: Decreased activity tolerance;Impaired balance (sitting and/or standing)      OT Treatment/Interventions: Self-care/ADL  training;Therapeutic exercise;Patient/family education;Balance training;Energy conservation;Therapeutic activities    OT Goals(Current goals can be found in the care plan section) Acute Rehab OT Goals Patient Stated Goal: to feel better OT Goal Formulation: With patient Time For Goal Achievement: 07/07/22 Potential to Achieve Goals: Good ADL Goals Pt/caregiver will Perform Home Exercise Program: Increased ROM;Increased strength;Independently Additional ADL Goal #1: Pt will describe important steps in diabetes management and will articulate a planning for consistently managing each of these steps. Additional ADL Goal #2: Pt will identify/describe 2+ strategies for improving sleep.  OT Frequency: Min 2X/week    Co-evaluation              AM-PAC OT "6 Clicks" Daily Activity     Outcome Measure Help from another person eating meals?: None Help from another person taking care of personal grooming?: None Help from another person toileting, which includes using toliet, bedpan, or urinal?: None Help from another person bathing (including washing, rinsing, drying)?: None Help from another person  to put on and taking off regular upper body clothing?: None Help from another person to put on and taking off regular lower body clothing?: None 6 Click Score: 24   End of Session    Activity Tolerance: Patient tolerated treatment well Patient left: in bed;with call bell/phone within reach;with bed alarm set  OT Visit Diagnosis: Muscle weakness (generalized) (M62.81);Other abnormalities of gait and mobility (R26.89)                Time: [id number] OT Time Calculation (min): 25 min Charges:  OT General Charges $OT Visit: 1 Visit OT Evaluation $OT Eval Low Complexity: 1 Low OT Treatments $Self Care/Home Management : 23-37 mins Josiah Lobo, PhD, MS, OTR/L 06/23/22, 4:46 PM

## 2022-06-23 NOTE — Plan of Care (Signed)

## 2022-06-24 ENCOUNTER — Observation Stay
Admit: 2022-06-24 | Discharge: 2022-06-24 | Disposition: A | Discharge status: Home / Self Care | Payer: Medicare Other | Attending: Internal Medicine | Admitting: Internal Medicine

## 2022-06-24 DIAGNOSIS — R42 Dizziness and giddiness: Secondary | ICD-10-CM | POA: Diagnosis not present

## 2022-06-24 LAB — BASIC METABOLIC PANEL
Anion gap: 6 (ref 5–15)
BUN: 11 mg/dL (ref 8–23)
CO2: 24 mmol/L (ref 22–32)
Calcium: 8.8 mg/dL — ABNORMAL LOW (ref 8.9–10.3)
Chloride: 112 mmol/L — ABNORMAL HIGH (ref 98–111)
Creatinine, Ser: 1.06 mg/dL (ref 0.61–1.24)
GFR, Estimated: >60 mL/min (ref >60)
Glucose, Bld: 90 mg/dL (ref 70–99)
Potassium: 3.5 mmol/L (ref 3.5–5.1)
Sodium: 142 mmol/L (ref 135–145)

## 2022-06-24 LAB — LACTIC ACID, PLASMA: Lactic Acid, Venous: 0.9 mmol/L (ref 0.5–1.9)

## 2022-06-24 LAB — GLUCOSE, CAPILLARY
Glucose-Capillary: 90 mg/dL (ref 70–99)
Glucose-Capillary: 163 mg/dL — ABNORMAL HIGH (ref 70–99)
Glucose-Capillary: 77 mg/dL (ref 70–99)
Glucose-Capillary: 117 mg/dL — ABNORMAL HIGH (ref 70–99)
Glucose-Capillary: 102 mg/dL — ABNORMAL HIGH (ref 70–99)

## 2022-06-24 LAB — HEMOGLOBIN A1C
Hgb A1c MFr Bld: 6 % — ABNORMAL HIGH (ref 4.8–5.6)
Mean Plasma Glucose: 125.5 mg/dL

## 2022-06-24 LAB — MAGNESIUM: Magnesium: 1.8 mg/dL (ref 1.7–2.4)

## 2022-06-24 LAB — FOLATE: Folate: 9.5 ng/mL (ref >5.9)

## 2022-06-24 MED ORDER — INFLUENZA VAC A&B SA ADJ QUAD 0.5 ML IM PRSY
0.5000 mL | PREFILLED_SYRINGE | INTRAMUSCULAR | Status: DC
  Filled 2022-06-24: qty 0.5

## 2022-06-24 MED ORDER — POTASSIUM CHLORIDE CRYS ER 20 MEQ PO TBCR
40.0000 meq | EXTENDED_RELEASE_TABLET | Freq: Once | ORAL | Status: AC
  Administered 2022-06-24: 40 meq via ORAL
  Filled 2022-06-24: qty 2

## 2022-06-24 MED ORDER — ACETAMINOPHEN 325 MG PO TABS
650.0000 mg | ORAL_TABLET | Freq: Four times a day (QID) | ORAL | Status: DC | PRN
  Administered 2022-06-24: 650 mg via ORAL
  Filled 2022-06-24: qty 2

## 2022-06-24 MED ORDER — MAGNESIUM OXIDE -MG SUPPLEMENT 400 (240 MG) MG PO TABS
800.0000 mg | ORAL_TABLET | Freq: Once | ORAL | Status: AC
  Administered 2022-06-24: 800 mg via ORAL
  Filled 2022-06-24: qty 2

## 2022-06-24 MED ORDER — PNEUMOCOCCAL 20-VAL CONJ VACC 0.5 ML IM SUSY
0.5000 mL | PREFILLED_SYRINGE | INTRAMUSCULAR | Status: AC
  Administered 2022-06-25: 0.5 mL via INTRAMUSCULAR
  Filled 2022-06-24: qty 0.5

## 2022-06-24 MED ORDER — PERFLUTREN LIPID MICROSPHERE
1.0000 mL | INTRAVENOUS | Status: AC | PRN
  Administered 2022-06-24: 4 mL via INTRAVENOUS

## 2022-06-24 MED ORDER — VITAMIN B-12 100 MCG PO TABS
100.0000 ug | ORAL_TABLET | Freq: Every day | ORAL | Status: DC
  Administered 2022-06-24 – 2022-06-25 (×2): 100 ug via ORAL
  Filled 2022-06-24 (×2): qty 1

## 2022-06-24 MED ORDER — VITAMIN D 25 MCG (1000 UNIT) PO TABS
1000.0000 [IU] | ORAL_TABLET | Freq: Every day | ORAL | Status: DC
  Administered 2022-06-24 – 2022-06-25 (×2): 1000 [IU] via ORAL
  Filled 2022-06-24 (×2): qty 1

## 2022-06-24 NOTE — Progress Notes (Signed)
Chaplain responded to phone call about pt. Pt was receptive and welcoming. Pt. Shares he is not religious but spiritual. Clare Gandy finds peace and grounding in music. Pt. Shared he is fearful of his current condition and believes he is in great care. Clare Gandy also shared he finds connection and stability in [employer]. Chaplain provided empathetic listening, soothing conversations and acknowledged his feelings. Chaplain provided local AA meeting contact information  [phone]) for PT. to contact for peace and reassurance. Pt. Was grateful for Chaplain's visit. Please contact [place] if requested.

## 2022-06-24 NOTE — Progress Notes (Signed)
PROGRESS NOTE    Glenn Barnett.  RKY:[id number] DOB: 07/19/1951 DOA: 06/23/2022 PCP: Pcp, No   Brief Narrative:  71 y.o. male with medical history significant of DM2 comes to the hospital with complaints of dizziness.  Patient states for the past 3-4 days he has had dizziness and multiple falls therefore came to the ED for evaluation.  He has been to the ED about 3-4 times in last 4 days due to similar complaints.  Couple of times her suspicion for dehydration requiring IV fluids.  Denies any other complaints.  He lives in a shelter therefore does not take his diabetic medications consistently and has had frequently fluctuating blood glucose. In the ED his routine blood work was unremarkable.  CT of the head showed 5 mm subdural hematoma without midline shift.  He also had elevated D-dimer therefore CTA chest was done which was negative for PE but showed chronic COPD changes.  Had mild lactic acidosis for which received IV fluids. Found to have Vit D and B12 def. Carotid Dopplers unremarkable.    Assessment & Plan:  Principal Problem:   Dizziness Active Problems:   Depression   Subdural hematoma (HCC)      Dizziness and unsteady gait Subdural hematoma 5 mm.  No midline shift -CT head shows 5 mm subdural hematoma without midline shift.  No further investigation per [facility].  -D-dimer mildly elevated but CTA chest negative for PE.  COPD changes but he denies smoking cigarettes.  echocardiogram, carotid Dopplers = Normal - PT/OT- pending.   Vit D and Vit B12 Def -Supplements ordered.    Diabetes mellitus type 2 - Tells me he lives in a shelter therefore his sugars have been fluctuating a lot.  A1c 6.0  Hold off on home antidiabetic regimen. - Accu-Cheks and sliding scale      PT/OT   DVT prophylaxis: SCDs Code Status: Full Code Family Communication:    Pending PT/OT, Echo and safe dispo   Subjective:  Feels ok, still unsteady times.    Examination:  General  exam: Appears calm and comfortable  Respiratory system: Clear to auscultation. Respiratory effort normal. Cardiovascular system: S1 & S2 heard, RRR. No JVD, murmurs, rubs, gallops or clicks. No pedal edema. Gastrointestinal system: Abdomen is nondistended, soft and nontender. No organomegaly or masses felt. Normal bowel sounds heard. Central nervous system: Alert and oriented. No focal neurological deficits. Extremities: Symmetric 5 x 5 power. Skin: No rashes, lesions or ulcers Psychiatry: Judgement and insight appear normal. Mood & affect appropriate.     Objective: Vitals:   06/23/22 1507 06/23/22 2010 06/24/22 0337 06/24/22 0415  BP: 138/71 (!) 158/78 (!) 151/84   Pulse: 62 64 76   Resp:  17 17   Temp: 98.3 F (36.8 C)  97.8 F (36.6 C)   TempSrc:      SpO2: 100% 99% 98%   Weight:    107.2 kg    Intake/Output Summary (Last 24 hours) at 06/24/2022 1006 Last data filed at 06/24/2022 0418 Gross per 24 hour  Intake 1373.75 ml  Output --  Net 1373.75 ml   Filed Weights   06/24/22 0415  Weight: 107.2 kg     Data Reviewed:   CBC: Recent Labs  Lab 06/19/22 0721 06/21/22 0745 06/23/22 0738  WBC 10.4 10.6* 9.4  NEUTROABS  --  8.5* 7.0  HGB 17.1* 16.6 16.5  HCT 52.8* 50.4 50.9  MCV 88.0 89.8 88.1  PLT 321 328 315   Basic Metabolic Panel: Recent  Labs  Lab 06/19/22 0721 06/21/22 0745 06/23/22 0738 06/24/22 0555  NA 138 140 142 142  K 3.7 3.2* 3.4* 3.5  CL 106 108 107 112*  CO2 22 24 26 24   GLUCOSE 190* 159* 108* 90  BUN 16 16 12 11   CREATININE 1.26* 1.17 1.14 1.06  CALCIUM 9.0 8.9 9.3 8.8*  MG  --   --   --  1.8   GFR: Estimated Creatinine Clearance: 79.7 mL/min (by C-G formula based on SCr of 1.06 mg/dL). Liver Function Tests: Recent Labs  Lab 06/21/22 0745 06/23/22 0738  AST 18 20  ALT 13 11  ALKPHOS 59 64  BILITOT 1.3* 1.2  PROT 6.7 6.8  ALBUMIN 3.5 3.6   No results for input(s): "LIPASE", "AMYLASE" in the last 168 hours. No results for  input(s): "AMMONIA" in the last 168 hours. Coagulation Profile: No results for input(s): "INR", "PROTIME" in the last 168 hours. Cardiac Enzymes: No results for input(s): "CKTOTAL", "CKMB", "CKMBINDEX", "TROPONINI" in the last 168 hours. BNP (last 3 results) No results for input(s): "PROBNP" in the last 8760 hours. HbA1C: Recent Labs    06/24/22 0555  HGBA1C 6.0*   CBG: Recent Labs  Lab 06/22/22 0811 06/23/22 1648 06/23/22 2126 06/24/22 0748  GLUCAP 97 100* 88 90   Lipid Profile: No results for input(s): "CHOL", "HDL", "LDLCALC", "TRIG", "CHOLHDL", "LDLDIRECT" in the last 72 hours. Thyroid Function Tests: Recent Labs    06/23/22 1242  TSH 0.708   Anemia Panel: Recent Labs    06/23/22 1242 06/24/22 0555  VITAMINB12 125*  --   FOLATE  --  9.5  FERRITIN 210  --   TIBC 244*  --   IRON 49  --    Sepsis Labs: Recent Labs  Lab 06/23/22 0751 06/24/22 0555  LATICACIDVEN 2.0*  2.1* 0.9    Recent Results (from the past 240 hour(s))  SARS Coronavirus 2 by RT PCR (hospital order, performed in [facility] hospital lab) *cepheid single result test* Anterior Nasal Swab     Status: None   Collection Time: 06/19/22  7:45 AM   Specimen: Anterior Nasal Swab  Result Value Ref Range Status   SARS Coronavirus 2 by RT PCR NEGATIVE NEGATIVE Final    Comment: (NOTE) SARS-CoV-2 target nucleic acids are NOT DETECTED.  The SARS-CoV-2 RNA is generally detectable in upper and lower respiratory specimens during the acute phase of infection. The lowest concentration of SARS-CoV-2 viral copies this assay can detect is 250 copies / mL. A negative result does not preclude SARS-CoV-2 infection and should not be used as the sole basis for treatment or other patient management decisions.  A negative result may occur with improper specimen collection / handling, submission of specimen other than nasopharyngeal swab, presence of viral mutation(s) within the areas targeted by this assay, and  inadequate number of viral copies (<250 copies / mL). A negative result must be combined with clinical observations, patient history, and epidemiological information.  Fact Sheet for Patients:   [facility]  Fact Sheet for Healthcare Providers: 08/19/22  This test is not yet approved or  cleared by the [employer] FDA and has been authorized for detection and/or diagnosis of SARS-CoV-2 by FDA under an Emergency Use Authorization (EUA).  This EUA will remain in effect (meaning this test can be used) for the duration of the COVID-19 declaration under Section 564(b)(1) of the Act, 21 U.S.C. section 360bbb-3(b)(1), unless the authorization is terminated or revoked sooner.  Performed at [facility]  Lab, [address] [place], [place] [address]          Radiology Studies: US Carotid Bilateral  Result Date: 06/23/2022 CLINICAL DATA:  71 year old male with dizziness EXAM: BILATERAL CAROTID DUPLEX ULTRASOUND TECHNIQUE: Wallace CullensGray scale imaging, color Doppler and duplex ultrasound were performed of bilateral carotid and vertebral arteries in the neck. COMPARISON:  None Available. FINDINGS: Criteria: Quantification of carotid stenosis is based on velocity parameters that correlate the residual internal carotid diameter with NASCET-based stenosis levels, using the diameter of the distal internal carotid lumen as the denominator for stenosis measurement. The following velocity measurements were obtained: RIGHT ICA:  Systolic 55 cm/sec, Diastolic 23 cm/sec CCA:  66 cm/sec SYSTOLIC ICA/CCA RATIO:  0.8 ECA:  67 cm/sec LEFT ICA:  Systolic 72 cm/sec, Diastolic 26 cm/sec CCA:  56 cm/sec SYSTOLIC ICA/CCA RATIO:  1.3 ECA:  57 cm/sec Right Brachial SBP: Not acquired Left Brachial SBP: Not acquired RIGHT CAROTID ARTERY: No significant calcified disease of the right common carotid artery. Intermediate waveform maintained. Heterogeneous plaque  without significant calcifications at the right carotid bifurcation. Low resistance waveform of the right ICA. No significant tortuosity. RIGHT VERTEBRAL ARTERY: Antegrade flow with low resistance waveform. LEFT CAROTID ARTERY: No significant calcified disease of the left common carotid artery. Intermediate waveform maintained. Heterogeneous plaque at the left carotid bifurcation without significant calcifications. Low resistance waveform of the left ICA. LEFT VERTEBRAL ARTERY:  Antegrade flow with low resistance waveform. IMPRESSION: Color duplex indicates minimal heterogeneous plaque, with no hemodynamically significant stenosis by duplex criteria in the extracranial cerebrovascular circulation. Signed, Yvone NeuJaime S. Miachel RouxWagner, DO, ABVM, RPVI [facility] Electronically Signed   By: Gilmer MorJaime  Wagner D.O.   On: 06/23/2022 15:27   CT Angio Chest PE W and/or Wo Contrast  Result Date: 06/23/2022 CLINICAL DATA:  Suspected pulmonary embolism in a 71 year old male who presents with lightheadedness and shortness of breath EXAM: CT ANGIOGRAPHY CHEST WITH CONTRAST TECHNIQUE: Multidetector CT imaging of the chest was performed using the standard protocol during bolus administration of intravenous contrast. Multiplanar CT image reconstructions and MIPs were obtained to evaluate the vascular anatomy. RADIATION DOSE REDUCTION: This exam was performed according to the departmental dose-optimization program which includes automated exposure control, adjustment of the mA and/or kV according to patient size and/or use of iterative reconstruction technique. CONTRAST:  75mL OMNIPAQUE IOHEXOL 350 MG/ML SOLN COMPARISON:  Only chest radiograph surveil a bulbar for comparison. FINDINGS: Cardiovascular: Pulmonary arteries are opacified to 557 Hounsfield units. Study is negative for pulmonary embolism. Heart size is normal. No pericardial effusion or signs of pericardial nodularity.  Scattered three-vessel coronary artery calcifications. Aorta is normal caliber and not well evaluated due to bolus timing which is optimized for pulmonary arterial evaluation. Mediastinum/Nodes: Patulous esophagus. The no sign of adenopathy in the chest. Lungs/Pleura: No pneumothorax. No sign of pleural effusion. No lobar consolidative process. Mild bronchial wall thickening. Airways are patent. Upper Abdomen: Incidental imaging of upper abdominal contents without acute process. Small hepatic cysts. Cholelithiasis. Incomplete imaging of upper abdominal structures. Musculoskeletal: No acute bone finding. No destructive bone process. Spinal degenerative changes. Review of the MIP images confirms the above findings. IMPRESSION: 1. Negative for pulmonary embolism. 2. Mild bronchial wall thickening, correlate with any symptoms of bronchitis/COPD. 3. Scattered three-vessel coronary artery calcifications. 4. Cholelithiasis. Electronically Signed   By: Donzetta KohutGeoffrey  Wile M.D.   On: 06/23/2022 08:42   CT Head Wo Contrast  Result Date: 06/23/2022 CLINICAL DATA:  Dizziness, persistent/recurrent, cardiac or vascular  cause suspected EXAM: CT HEAD WITHOUT CONTRAST TECHNIQUE: Contiguous axial images were obtained from the base of the skull through the vertex without intravenous contrast. RADIATION DOSE REDUCTION: This exam was performed according to the departmental dose-optimization program which includes automated exposure control, adjustment of the mA and/or kV according to patient size and/or use of iterative reconstruction technique. COMPARISON:  CT dated April 28, 2022, March 20, 2021 FINDINGS: Brain: There is an isodense extra-axial fluid collection overlying the RIGHT frontoparietal lobe which spans approximately 5 mm, new in comparison to priors (series 4, image 46). No significant midline shift. No evidence of acute infarction, intraparenchymal hemorrhage or new hydrocephalus. Unchanged encephalomalacia of the RIGHT  frontal lobe. Vascular: Aneurysm clip. Skull: Status post RIGHT frontal craniotomy. Sinuses/Orbits: Scattered opacification of the sinuses, most predominant throughout the ethmoid air cells and the RIGHT frontal sinuses. Other: None. IMPRESSION: Chronic appearing approximately 5 mm subdural hematoma overlying the RIGHT frontoparietal lobe, new in comparison to prior. No significant midline shift. These results were called by telephone at the time of interpretation on 06/23/2022 at 8:18 am to provider [facility] , who verbally acknowledged these results. Electronically Signed   By: Meda Klinefelter M.D.   On: 06/23/2022 08:21   DG Chest Portable 1 View  Result Date: 06/23/2022 CLINICAL DATA:  sob EXAM: PORTABLE CHEST 1 VIEW COMPARISON:  April 28, 2022 FINDINGS: The cardiomediastinal silhouette is unchanged in contour.Tortuous thoracic aorta. No pleural effusion. No pneumothorax. No acute pleuroparenchymal abnormality. Visualized abdomen is unremarkable. IMPRESSION: No acute cardiopulmonary abnormality. Electronically Signed   By: Meda Klinefelter M.D.   On: 06/23/2022 08:06        Scheduled Meds:  cholecalciferol  1,000 Units Oral Daily   influenza vaccine adjuvanted  0.5 mL Intramuscular Tomorrow-1000   insulin aspart  0-15 Units Subcutaneous TID WC   insulin aspart  0-5 Units Subcutaneous QHS   magnesium oxide  800 mg Oral Once   pneumococcal 20-valent conjugate vaccine  0.5 mL Intramuscular Tomorrow-1000   potassium chloride  40 mEq Oral Once   vitamin B-12  100 mcg Oral Daily   Continuous Infusions:  sodium chloride 75 mL/hr at 06/24/22 0605     LOS: 0 days   Time spent= 35 mins    Sarim Rothman Joline Maxcy, MD [facility]  If 7PM-7AM, please contact night-coverage  06/24/2022, 10:06 AM

## 2022-06-24 NOTE — Plan of Care (Signed)

## 2022-06-24 NOTE — Plan of Care (Signed)
  Problem: Health Behavior/Discharge Planning: Goal: Ability to manage health-related needs will improve Outcome: Progressing   Problem: Nutritional: Goal: Maintenance of adequate nutrition will improve Outcome: Progressing   Problem: Health Behavior/Discharge Planning: Goal: Ability to manage health-related needs will improve Outcome: Progressing   Problem: Activity: Goal: Risk for activity intolerance will decrease Outcome: Progressing   Problem: Elimination: Goal: Will not experience complications related to urinary retention Outcome: Progressing   Problem: Pain Managment: Goal: General experience of comfort will improve Outcome: Progressing

## 2022-06-25 DIAGNOSIS — R42 Dizziness and giddiness: Secondary | ICD-10-CM | POA: Diagnosis not present

## 2022-06-25 LAB — BASIC METABOLIC PANEL
Anion gap: 6 (ref 5–15)
BUN: 11 mg/dL (ref 8–23)
CO2: 27 mmol/L (ref 22–32)
Calcium: 9 mg/dL (ref 8.9–10.3)
Chloride: 108 mmol/L (ref 98–111)
Creatinine, Ser: 1.05 mg/dL (ref 0.61–1.24)
GFR, Estimated: >60 mL/min (ref >60)
Glucose, Bld: 96 mg/dL (ref 70–99)
Potassium: 3.8 mmol/L (ref 3.5–5.1)
Sodium: 141 mmol/L (ref 135–145)

## 2022-06-25 LAB — MAGNESIUM: Magnesium: 2 mg/dL (ref 1.7–2.4)

## 2022-06-25 LAB — ECHOCARDIOGRAM COMPLETE
AR max vel: 3.44 cm2
AV Peak grad: 3 mmHg
Ao pk vel: 0.87 m/s
Area-P 1/2: 2.73 cm2
S' Lateral: 4 cm
Weight: 3781.33 oz

## 2022-06-25 LAB — GLUCOSE, CAPILLARY
Glucose-Capillary: 78 mg/dL (ref 70–99)
Glucose-Capillary: 108 mg/dL — ABNORMAL HIGH (ref 70–99)

## 2022-06-25 MED ORDER — ALBUTEROL SULFATE HFA 108 (90 BASE) MCG/ACT IN AERS: 2.0000 | INHALATION_SPRAY | Freq: Four times a day (QID) | RESPIRATORY_TRACT | 2 refills | Status: AC | PRN

## 2022-06-25 MED ORDER — PREDNISONE 20 MG PO TABS
40.0000 mg | ORAL_TABLET | Freq: Every day | ORAL | Status: DC
  Administered 2022-06-25: 40 mg via ORAL
  Filled 2022-06-25: qty 2

## 2022-06-25 MED ORDER — CYANOCOBALAMIN 100 MCG PO TABS: 100.0000 ug | ORAL_TABLET | Freq: Every day | ORAL | 0 refills | Status: AC

## 2022-06-25 MED ORDER — PREDNISONE 20 MG PO TABS: 40.0000 mg | ORAL_TABLET | Freq: Every day | ORAL | 0 refills | Status: AC

## 2022-06-25 MED ORDER — VITAMIN D3 25 MCG PO TABS: 1000.0000 [IU] | ORAL_TABLET | Freq: Every day | ORAL | 0 refills | Status: AC

## 2022-06-25 MED ORDER — INFLUENZA VAC A&B SA ADJ QUAD 0.5 ML IM PRSY
0.5000 mL | PREFILLED_SYRINGE | Freq: Once | INTRAMUSCULAR | Status: AC
  Administered 2022-06-25: 0.5 mL via INTRAMUSCULAR
  Filled 2022-06-25: qty 0.5

## 2022-06-25 MED ORDER — IPRATROPIUM-ALBUTEROL 0.5-2.5 (3) MG/3ML IN SOLN
3.0000 mL | Freq: Two times a day (BID) | RESPIRATORY_TRACT | Status: DC
  Administered 2022-06-25: 3 mL via RESPIRATORY_TRACT
  Filled 2022-06-25: qty 3

## 2022-06-25 NOTE — Discharge Summary (Signed)
Physician Discharge Summary  [name]. [id number] DOB: 06/06/1951 DOA: 06/23/2022  PCP: Pcp, No  Admit date: 06/23/2022 Discharge date: 06/25/2022  Admitted From: Home Disposition:  Home  Recommendations for Outpatient Follow-up:  Follow up with PCP in 1-2 weeks Please obtain BMP/CBC in one week your next doctors visit.  Prednisone PO daily for 4 more days  Albuterol prescribe  Vit D abd B12 prescribed    Discharge Condition: Stable CODE STATUS: Full Code  Diet recommendation: Diabetic.   Brief/Interim Summary: Brief Narrative:  71 y.o. male with medical history significant of DM2 comes to the hospital with complaints of dizziness.  Patient states for the past 3-4 days he has had dizziness and multiple falls therefore came to the ED for evaluation.  He has been to the ED about 3-4 times in last 4 days due to similar complaints.  Couple of times her suspicion for dehydration requiring IV fluids.  Denies any other complaints.  He lives in a shelter therefore does not take his diabetic medications consistently and has had frequently fluctuating blood glucose. In the ED his routine blood work was unremarkable.  CT of the head showed 5 mm subdural hematoma without midline shift.  He also had elevated D-dimer therefore CTA chest was done which was negative for PE but showed chronic COPD changes.  Had mild lactic acidosis for which received IV fluids. Found to have Vit D and B12 def. Carotid Dopplers unremarkable.  Echo showed 50% otherwise unremarkable.  PT/OT - did well.   I updated daughter on the day of discharge. He is stable for dc.      Assessment & Plan:  Principal Problem:   Dizziness Active Problems:   Depression   Subdural hematoma (HCC)       Dizziness and unsteady gait Subdural hematoma 5 mm.  No midline shift -CT head shows 5 mm subdural hematoma without midline shift.  No further investigation per NeuroSx. he is asymptomatic.  -D-dimer mildly elevated but  CTA chest negative for PE.  COPD changes but he denies smoking cigarettes.  echocardiogram, carotid Dopplers = Normal - PT/OT- No follow up needed.    Vit D and Vit B12 Def -Supplements ordered.   Abnormal Breath Sounds -Suspect undiagnosed COPD, Prednisone and bronchodilators prescribed.    Diabetes mellitus type 2 - Tells me he lives in a shelter therefore his sugars have been fluctuating a lot.  A1c 6.0  Resume home regimen.           Discharge Diagnoses:  Principal Problem:   Dizziness Active Problems:   Depression   Subdural hematoma (HCC)      Consultations: NeuroSx  Subjective: Feels well, slightly congested but no other complaints.   Discharge Exam: Vitals:   06/25/22 0802 06/25/22 0926  BP: (!) 122/55   Pulse: 63   Resp: 18   Temp: 98.5 F (36.9 C)   SpO2: 100% 99%   Vitals:   06/25/22 0500 06/25/22 0550 06/25/22 0802 06/25/22 0926  BP:  133/64 (!) 122/55   Pulse:  61 63   Resp:  17 18   Temp:  97.9 F (36.6 C) 98.5 F (36.9 C)   TempSrc:   Oral   SpO2:  99% 100% 99%  Weight: 107.7 kg       General: Pt is alert, awake, not in acute distress Cardiovascular: RRR, S1/S2 +, no rubs, no gallops Respiratory: minimal exp wheezing, otherwise clear.  Abdominal: Soft, NT, ND, bowel sounds + Extremities: no edema,  no cyanosis  Discharge Instructions   Allergies as of 06/25/2022       Reactions   Morphine    Other reaction(s): Other (See Comments), Other (See Comments) Recovering drug addict Recovering drug addict   Other Other (See Comments)   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        Medication List     TAKE these medications    albuterol 108 (90 Base) MCG/ACT inhaler Commonly known as: VENTOLIN HFA Inhale 2 puffs into the lungs every 6 (six) hours as needed for wheezing or shortness of breath.   cyanocobalamin 100 MCG tablet Take 1 tablet (100 mcg total) by mouth daily.   metFORMIN 500 MG  tablet Commonly known as: GLUCOPHAGE Take 1 tablet (500 mg total) by mouth 2 (two) times daily with a meal.   predniSONE 20 MG tablet Commonly known as: DELTASONE Take 2 tablets (40 mg total) by mouth daily with breakfast for 4 days.   vitamin D3 25 MCG tablet Commonly known as: CHOLECALCIFEROL Take 1 tablet (1,000 Units total) by mouth daily.        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    You were cared for by a hospitalist during your hospital stay. If you have any questions about your discharge medications or the care you received while you were in the hospital after you are discharged, you can call the unit and asked to speak with the hospitalist on call if the hospitalist that took care of you is not available. Once you are discharged, your primary care physician will handle any further medical issues. Please note that no refills for any discharge medications will be authorized once you are discharged, as it is imperative that you return to your primary care physician (or establish a relationship with a primary care physician if you do not have one) for your aftercare needs so that they can reassess your need for medications and monitor your lab values.   Procedures/Studies: ECHOCARDIOGRAM COMPLETE  Result Date: 06/25/2022    ECHOCARDIOGRAM REPORT   Patient Name:   Adriyel Bahner. Date of Exam: 06/24/2022 Medical Rec #:  [id number]          Height:       72.0 in Accession #:    [id number]         Weight:       236.3 lb Date of Birth:  08-Apr-1951          BSA:          2.287 m Patient Age:    56 years           BP:           151/84 mmHg Patient Gender: M                  HR:           61 bpm. Exam Location:  [facility] Procedure: 2D Echo and Intracardiac Opacification Agent  Indications:     Syncope  History:         Patient has no prior history of Echocardiogram examinations.  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  Sonographer:     L Thornton-Maynard Referring Phys:  Gerlean Ren, CHIRAG Diagnosing Phys: Isaias Cowman MD  Sonographer Comments: Technically difficult study due to poor echo windows. IMPRESSIONS  1. Left ventricular ejection fraction, by estimation, is 50 to 55%. The left ventricle has low normal function. The left ventricle has no regional wall motion abnormalities. Left ventricular diastolic parameters were normal.  2. Right ventricular systolic function is normal. The right ventricular size is normal.  3. The mitral valve is normal in structure. Trivial mitral valve regurgitation. No evidence of mitral stenosis.  4. The aortic valve is normal in structure. Aortic valve regurgitation is not visualized. No aortic stenosis is present.  5. The inferior vena cava is normal in size with greater than 50% respiratory variability, suggesting right atrial pressure of 3 mmHg. FINDINGS  Left Ventricle: Left ventricular ejection fraction, by estimation, is 50 to 55%. The left ventricle has low normal function. The left ventricle has no regional wall motion abnormalities. Definity contrast agent was given IV to delineate the left ventricular endocardial borders. The left ventricular internal cavity size was normal in size. There is no left ventricular hypertrophy. Left ventricular diastolic parameters were normal. Right Ventricle: The right ventricular size is normal. No increase in right ventricular wall thickness. Right ventricular systolic function is normal. Left Atrium: Left atrial size was normal in size. Right Atrium: Right atrial size was normal in size. Pericardium: There is no evidence of pericardial effusion. Mitral Valve: The mitral valve is normal in structure. Trivial mitral valve regurgitation. No evidence of mitral valve stenosis.  Tricuspid Valve: The tricuspid valve is normal in structure. Tricuspid valve regurgitation is trivial. No evidence of tricuspid stenosis. Aortic Valve: The aortic valve is normal in structure. Aortic valve regurgitation is not visualized. No aortic stenosis is present. Aortic valve peak gradient measures 3.0 mmHg. Pulmonic Valve: The pulmonic valve was normal in structure. Pulmonic valve regurgitation is not visualized. No evidence of pulmonic stenosis. Aorta: The aortic root is normal in size and structure. Venous: The inferior vena cava is normal in size with greater than 50% respiratory variability, suggesting right atrial pressure of 3 mmHg. IAS/Shunts: No atrial level shunt detected by color flow Doppler.  LEFT VENTRICLE PLAX 2D LVIDd:         4.80 cm   Diastology LVIDs:         4.00 cm   LV e' medial:    6.31 cm/s LV PW:         1.80 cm   LV E/e' medial:  8.1 LV IVS:        1.00 cm   LV e' lateral:   8.16 cm/s LVOT diam:     2.30 cm   LV E/e' lateral: 6.2 LV SV:         52 LV SV Index:   23 LVOT Area:     4.15 cm  IVC IVC diam: 1.90 cm LEFT ATRIUM             Index        RIGHT ATRIUM           Index LA Vol (A2C):   45.5 ml 19.89 ml/m  RA Area:     17.60 cm LA Vol (A4C):   71.2 ml 31.13 ml/m  RA Volume:   47.90 ml  20.94 ml/m LA Biplane Vol: 56.9 ml 24.88 ml/m  AORTIC VALVE AV Area (Vmax): 3.44 cm AV Vmax:  87.30 cm/s AV Peak Grad:   3.0 mmHg LVOT Vmax:      72.30 cm/s LVOT Vmean:     47.700 cm/s LVOT VTI:       0.125 m  AORTA Ao Root diam: 3.70 cm MITRAL VALVE MV Area (PHT): 2.73 cm    SHUNTS MV Decel Time: 278 msec    Systemic VTI:  0.12 m MV E velocity: 50.80 cm/s  Systemic Diam: 2.30 cm MV A velocity: 52.90 cm/s MV E/A ratio:  0.96 Isaias Cowman MD Electronically signed by Isaias Cowman MD Signature Date/Time: 06/25/2022/10:17:07 AM    Final    US Carotid Bilateral  Result Date: 06/23/2022 CLINICAL DATA:  71 year old male with dizziness EXAM: BILATERAL CAROTID DUPLEX ULTRASOUND  TECHNIQUE: Pearline Cables scale imaging, color Doppler and duplex ultrasound were performed of bilateral carotid and vertebral arteries in the neck. COMPARISON:  None Available. FINDINGS: Criteria: Quantification of carotid stenosis is based on velocity parameters that correlate the residual internal carotid diameter with NASCET-based stenosis levels, using the diameter of the distal internal carotid lumen as the denominator for stenosis measurement. The following velocity measurements were obtained: RIGHT ICA:  Systolic 55 cm/sec, Diastolic 23 cm/sec CCA:  66 cm/sec SYSTOLIC ICA/CCA RATIO:  0.8 ECA:  67 cm/sec LEFT ICA:  Systolic 72 cm/sec, Diastolic 26 cm/sec CCA:  56 cm/sec SYSTOLIC ICA/CCA RATIO:  1.3 ECA:  57 cm/sec Right Brachial SBP: Not acquired Left Brachial SBP: Not acquired RIGHT CAROTID ARTERY: No significant calcified disease of the right common carotid artery. Intermediate waveform maintained. Heterogeneous plaque without significant calcifications at the right carotid bifurcation. Low resistance waveform of the right ICA. No significant tortuosity. RIGHT VERTEBRAL ARTERY: Antegrade flow with low resistance waveform. LEFT CAROTID ARTERY: No significant calcified disease of the left common carotid artery. Intermediate waveform maintained. Heterogeneous plaque at the left carotid bifurcation without significant calcifications. Low resistance waveform of the left ICA. LEFT VERTEBRAL ARTERY:  Antegrade flow with low resistance waveform. IMPRESSION: Color duplex indicates minimal heterogeneous plaque, with no hemodynamically significant stenosis by duplex criteria in the extracranial cerebrovascular circulation. Signed, Dulcy Fanny. Nadene Rubins, RPVI [facility] Radiology Electronically Signed   By: Corrie Mckusick D.O.   On: 06/23/2022 15:27   CT Angio Chest PE W and/or Wo Contrast  Result Date: 06/23/2022 CLINICAL DATA:  Suspected pulmonary embolism in a  71 year old male who presents with lightheadedness and shortness of breath EXAM: CT ANGIOGRAPHY CHEST WITH CONTRAST TECHNIQUE: Multidetector CT imaging of the chest was performed using the standard protocol during bolus administration of intravenous contrast. Multiplanar CT image reconstructions and MIPs were obtained to evaluate the vascular anatomy. RADIATION DOSE REDUCTION: This exam was performed according to the departmental dose-optimization program which includes automated exposure control, adjustment of the mA and/or kV according to patient size and/or use of iterative reconstruction technique. CONTRAST:  56mL OMNIPAQUE IOHEXOL 350 MG/ML SOLN COMPARISON:  Only chest radiograph surveil a bulbar for comparison. FINDINGS: Cardiovascular: Pulmonary arteries are opacified to 557 Hounsfield units. Study is negative for pulmonary embolism. Heart size is normal. No pericardial effusion or signs of pericardial nodularity. Scattered three-vessel coronary artery calcifications. Aorta is normal caliber and not well evaluated due to bolus timing which is optimized for pulmonary arterial evaluation. Mediastinum/Nodes: Patulous esophagus. The no sign of adenopathy in the chest. Lungs/Pleura: No pneumothorax. No sign of pleural effusion. No lobar consolidative process. Mild bronchial wall thickening. Airways are patent. Upper Abdomen: Incidental imaging of upper abdominal contents without acute process.  Small hepatic cysts. Cholelithiasis. Incomplete imaging of upper abdominal structures. Musculoskeletal: No acute bone finding. No destructive bone process. Spinal degenerative changes. Review of the MIP images confirms the above findings. IMPRESSION: 1. Negative for pulmonary embolism. 2. Mild bronchial wall thickening, correlate with any symptoms of bronchitis/COPD. 3. Scattered three-vessel coronary artery calcifications. 4. Cholelithiasis. Electronically Signed   By: Zetta Bills M.D.   On: 06/23/2022 08:42   CT Head  Wo Contrast  Result Date: 06/23/2022 CLINICAL DATA:  Dizziness, persistent/recurrent, cardiac or vascular cause suspected EXAM: CT HEAD WITHOUT CONTRAST TECHNIQUE: Contiguous axial images were obtained from the base of the skull through the vertex without intravenous contrast. RADIATION DOSE REDUCTION: This exam was performed according to the departmental dose-optimization program which includes automated exposure control, adjustment of the mA and/or kV according to patient size and/or use of iterative reconstruction technique. COMPARISON:  CT dated April 28, 2022, March 20, 2021 FINDINGS: Brain: There is an isodense extra-axial fluid collection overlying the RIGHT frontoparietal lobe which spans approximately 5 mm, new in comparison to priors (series 4, image 46). No significant midline shift. No evidence of acute infarction, intraparenchymal hemorrhage or new hydrocephalus. Unchanged encephalomalacia of the RIGHT frontal lobe. Vascular: Aneurysm clip. Skull: Status post RIGHT frontal craniotomy. Sinuses/Orbits: Scattered opacification of the sinuses, most predominant throughout the ethmoid air cells and the RIGHT frontal sinuses. Other: None. IMPRESSION: Chronic appearing approximately 5 mm subdural hematoma overlying the RIGHT frontoparietal lobe, new in comparison to prior. No significant midline shift. These results were called by telephone at the time of interpretation on 06/23/2022 at 8:18 am to provider [facility] , who verbally acknowledged these results. Electronically Signed   By: Valentino Saxon M.D.   On: 06/23/2022 08:21   DG Chest Portable 1 View  Result Date: 06/23/2022 CLINICAL DATA:  sob EXAM: PORTABLE CHEST 1 VIEW COMPARISON:  April 28, 2022 FINDINGS: The cardiomediastinal silhouette is unchanged in contour.Tortuous thoracic aorta. No pleural effusion. No pneumothorax. No acute pleuroparenchymal abnormality. Visualized abdomen is unremarkable. IMPRESSION: No acute cardiopulmonary  abnormality. Electronically Signed   By: Valentino Saxon M.D.   On: 06/23/2022 08:06     The results of significant diagnostics from this hospitalization (including imaging, microbiology, ancillary and laboratory) are listed below for reference.     Microbiology: Recent Results (from the past 240 hour(s))  SARS Coronavirus 2 by RT PCR (hospital order, performed in [facility] hospital lab) *cepheid single result test* Anterior Nasal Swab     Status: None   Collection Time: 06/19/22  7:45 AM   Specimen: Anterior Nasal Swab  Result Value Ref Range Status   SARS Coronavirus 2 by RT PCR NEGATIVE NEGATIVE Final    Comment: (NOTE) SARS-CoV-2 target nucleic acids are NOT DETECTED.  The SARS-CoV-2 RNA is generally detectable in upper and lower respiratory specimens during the acute phase of infection. The lowest concentration of SARS-CoV-2 viral copies this assay can detect is 250 copies / mL. A negative result does not preclude SARS-CoV-2 infection and should not be used as the sole basis for treatment or other patient management decisions.  A negative result may occur with improper specimen collection / handling, submission of specimen other than nasopharyngeal swab, presence of viral mutation(s) within the areas targeted by this assay, and inadequate number of viral copies (<250 copies / mL). A negative result must be combined with clinical observations, patient history, and epidemiological information.  Fact Sheet for Patients:   https://www.patel.info/  Fact Sheet for Healthcare Providers: https://hall.com/  This test is not yet approved or  cleared by the [place] and has been authorized for detection and/or diagnosis of SARS-CoV-2 by FDA under an Emergency Use Authorization (EUA).  This EUA will remain in effect (meaning this test can be used) for the duration of the COVID-19 declaration under Section 564(b)(1) of the Act, 21  U.S.C. section 360bbb-3(b)(1), unless the authorization is terminated or revoked sooner.  Performed at [facility], [address] [place], [place] [address]      Labs: BNP (last 3 results) Recent Labs    06/23/22 0738  BNP [id number]   Basic Metabolic Panel: Recent Labs  Lab 06/19/22 0721 06/21/22 0745 06/23/22 0738 06/24/22 0555 06/25/22 0513  NA 138 140 142 142 141  K 3.7 3.2* 3.4* 3.5 3.8  CL 106 108 107 112* 108  CO2 22 24 26 24 27   GLUCOSE 190* 159* 108* 90 96  BUN 16 16 12 11 11   CREATININE 1.26* 1.17 1.14 1.06 1.05  CALCIUM 9.0 8.9 9.3 8.8* 9.0  MG  --   --   --  1.8 2.0   Liver Function Tests: Recent Labs  Lab 06/21/22 0745 06/23/22 0738  AST 18 20  ALT 13 11  ALKPHOS 59 64  BILITOT 1.3* 1.2  PROT 6.7 6.8  ALBUMIN 3.5 3.6   No results for input(s): "LIPASE", "AMYLASE" in the last 168 hours. No results for input(s): "AMMONIA" in the last 168 hours. CBC: Recent Labs  Lab 06/19/22 0721 06/21/22 0745 06/23/22 0738  WBC 10.4 10.6* 9.4  NEUTROABS  --  8.5* 7.0  HGB 17.1* 16.6 16.5  HCT 52.8* 50.4 50.9  MCV 88.0 89.8 88.1  PLT 321 328 346   Cardiac Enzymes: No results for input(s): "CKTOTAL", "CKMB", "CKMBINDEX", "TROPONINI" in the last 168 hours. BNP: Invalid input(s): "POCBNP" CBG: Recent Labs  Lab 06/24/22 1117 06/24/22 1548 06/24/22 1842 06/24/22 2057 06/25/22 0809  GLUCAP 163* 77 117* 102* 78   D-Dimer Recent Labs    06/23/22 0738  DDIMER 0.51*   Hgb A1c Recent Labs    06/24/22 0555  HGBA1C 6.0*   Lipid Profile No results for input(s): "CHOL", "HDL", "LDLCALC", "TRIG", "CHOLHDL", "LDLDIRECT" in the last 72 hours. Thyroid function studies Recent Labs    06/23/22 1242  TSH 0.708   Anemia work up Recent Labs    06/23/22 1242 06/24/22 0555  VITAMINB12 125*  --   FOLATE  --  9.5  FERRITIN 210  --   TIBC 244*  --   IRON 49  --    Urinalysis    Component Value Date/Time   COLORURINE YELLOW (A)  06/23/2022 0751   APPEARANCEUR CLEAR (A) 06/23/2022 0751   LABSPEC >1.046 (H) 06/23/2022 0751   PHURINE 6.0 06/23/2022 0751   GLUCOSEU NEGATIVE 06/23/2022 0751   HGBUR NEGATIVE 06/23/2022 0751   BILIRUBINUR NEGATIVE 06/23/2022 [place] 06/23/2022 0751   PROTEINUR NEGATIVE 06/23/2022 0751   NITRITE NEGATIVE 06/23/2022 0751   LEUKOCYTESUR NEGATIVE 06/23/2022 0751   Sepsis Labs Recent Labs  Lab 06/19/22 0721 06/21/22 0745 06/23/22 0738  WBC 10.4 10.6* 9.4   Microbiology Recent Results (from the past 240 hour(s))  SARS Coronavirus 2 by RT PCR (hospital order, performed in [facility] hospital lab) *cepheid single result test* Anterior Nasal Swab     Status: None   Collection Time: 06/19/22  7:45 AM   Specimen: Anterior Nasal Swab  Result Value Ref Range Status   SARS  Coronavirus 2 by RT PCR NEGATIVE NEGATIVE Final    Comment: (NOTE) SARS-CoV-2 target nucleic acids are NOT DETECTED.  The SARS-CoV-2 RNA is generally detectable in upper and lower respiratory specimens during the acute phase of infection. The lowest concentration of SARS-CoV-2 viral copies this assay can detect is 250 copies / mL. A negative result does not preclude SARS-CoV-2 infection and should not be used as the sole basis for treatment or other patient management decisions.  A negative result may occur with improper specimen collection / handling, submission of specimen other than nasopharyngeal swab, presence of viral mutation(s) within the areas targeted by this assay, and inadequate number of viral copies (<250 copies / mL). A negative result must be combined with clinical observations, patient history, and epidemiological information.  Fact Sheet for Patients:   https://www.patel.info/  Fact Sheet for Healthcare Providers: https://hall.com/  This test is not yet approved or  cleared by the [place] FDA and has been authorized for  detection and/or diagnosis of SARS-CoV-2 by FDA under an Emergency Use Authorization (EUA).  This EUA will remain in effect (meaning this test can be used) for the duration of the COVID-19 declaration under Section 564(b)(1) of the Act, 21 U.S.C. section 360bbb-3(b)(1), unless the authorization is terminated or revoked sooner.  Performed at [facility], [address] [place], [place] [address]      Time coordinating discharge:  I have spent 35 minutes face to face with the patient and on the ward discussing the patients care, assessment, plan and disposition with other care givers. >50% of the time was devoted counseling the patient about the risks and benefits of treatment/Discharge disposition and coordinating care.   SIGNED:   Damita Lack, MD  [facility] 06/25/2022, 10:59 AM   If 7PM-7AM, please contact night-coverage

## 2022-06-25 NOTE — TOC Progression Note (Addendum)
Transition of Care (TOC) - Progression Note    Patient Details  Name: Glenn Barnett. MRN: [id number] Date of Birth: [birth year]  Transition of Care [facility]) CM/SW Contact  Izola Price, RN Phone Number: 06/25/2022, 12:00 PM  Clinical Narrative: 9/17: Per provider request provided patient with list of shelters, county 211 resources, PCP suggestion list, housing/shelter resources in the community. Patient is reported as homeless, but has a daughter who will pick patient up at discharge. Informed provider many resources not available over the weekends. Unit RN updated on materials/resources printed for patient. On 06/20/11 and 9/15, two RN CM's also gave extensive list of resources to patient as well. This CM notes that patient received $1900 in SSI benefit, but is unable to remember where the money was spent. Family expressed concerns to unit RN about obtaining a diagnosis of dementia "before he gets worse", a list of PCP's in the area was also provided to patient and follow up instructions in DC Summary. Neuro and psych consults were completed this admission as well. Patient has [facility] insurance. Simmie Davies RN CM     Update: DC to do List per provider.  Follow up with PCP in 1-2 weeks Please obtain BMP/CBC in one week your next doctors visit.  Prednisone PO daily for 4 more days  Albuterol prescribed Vit D abd B12 prescribed   Simmie Davies RN CM        Expected Discharge Plan and Services           Expected Discharge Date: 06/25/22                                     Social Determinants of Health (SDOH) Interventions    Readmission Risk Interventions     No data to display

## 2022-06-25 NOTE — Plan of Care (Signed)

## 2022-06-25 NOTE — Plan of Care (Signed)

## 2022-06-25 NOTE — TOC Transition Note (Signed)
Transition of Care [facility]) - CM/SW Discharge Note   Patient Details  Name: Glenn Barnett. MRN: [id number] Date of Birth: Dec 08, 1950  Transition of Care [facility]) CM/SW Contact:  Izola Price, RN Phone Number: 06/25/2022, 2:54 PM   Clinical Narrative: 9/17: Patient discharging and daughter is picking up. Patient from [facility] and has been given multiple streams of lists and resources in the community including primary care practitioners. Patient does have insurance. Has memory issues and was seen my neurosurgery and psychiatry during this admission. Follow up instructions in DC Summary per provider.  Follow up with PCP in 1-2 weeks Please obtain BMP/CBC in one week your next doctors visit.  Prednisone PO daily for 4 more days  Albuterol prescribe  Vit D abd B12 prescribed  Simmie Davies RN CM    Final next level of care: Other (comment) (Patient from [facility]. Daughter picking up.) Barriers to Discharge: Barriers Resolved   Patient Goals and CMS Choice     Choice offered to / list presented to : NA  Discharge Placement                       Discharge Plan and Services                DME Arranged: N/A DME Agency: NA       HH Arranged: NA HH Agency: NA        Social Determinants of Health (SDOH) Interventions     Readmission Risk Interventions     No data to display

## 2022-06-29 ENCOUNTER — Telehealth: Payer: Self-pay

## 2022-08-24 ENCOUNTER — Ambulatory Visit: Payer: Self-pay | Admitting: Nurse Practitioner

## 2022-09-14 ENCOUNTER — Emergency Department (HOSPITAL_COMMUNITY)
Admission: EM | Admit: 2022-09-14 | Discharge: 2022-09-14 | Disposition: A | Discharge status: Home / Self Care | Payer: Medicare Other | Attending: Emergency Medicine | Admitting: Emergency Medicine

## 2022-09-14 ENCOUNTER — Ambulatory Visit (INDEPENDENT_AMBULATORY_CARE_PROVIDER_SITE_OTHER)
Admission: EM | Admit: 2022-09-14 | Discharge: 2022-09-14 | Disposition: A | Discharge status: Home / Self Care | Payer: Medicare Other | Source: Home / Self Care

## 2022-09-14 ENCOUNTER — Ambulatory Visit: Payer: Self-pay | Admitting: Nurse Practitioner

## 2022-09-14 ENCOUNTER — Ambulatory Visit: Payer: Medicare Other | Admitting: Nurse Practitioner

## 2022-09-14 DIAGNOSIS — F329 Major depressive disorder, single episode, unspecified: Secondary | ICD-10-CM | POA: Insufficient documentation

## 2022-09-14 DIAGNOSIS — Z79899 Other long term (current) drug therapy: Secondary | ICD-10-CM | POA: Diagnosis not present

## 2022-09-14 DIAGNOSIS — Z5900 Homelessness unspecified: Secondary | ICD-10-CM | POA: Insufficient documentation

## 2022-09-14 DIAGNOSIS — R45851 Suicidal ideations: Secondary | ICD-10-CM

## 2022-09-14 DIAGNOSIS — Z789 Other specified health status: Secondary | ICD-10-CM | POA: Diagnosis not present

## 2022-09-14 DIAGNOSIS — F330 Major depressive disorder, recurrent, mild: Secondary | ICD-10-CM | POA: Diagnosis not present

## 2022-09-14 DIAGNOSIS — F32A Depression, unspecified: Secondary | ICD-10-CM | POA: Diagnosis present

## 2022-09-14 LAB — CBC
HCT: 47.9 % (ref 39.0–52.0)
Hemoglobin: 15.7 g/dL (ref 13.0–17.0)
MCH: 29.7 pg (ref 26.0–34.0)
MCHC: 32.8 g/dL (ref 30.0–36.0)
MCV: 90.7 fL (ref 80.0–100.0)
Platelets: 310 10*3/uL (ref 150–400)
RBC: 5.28 MIL/uL (ref 4.22–5.81)
RDW: 13.7 % (ref 11.5–15.5)
WBC: 10.3 10*3/uL (ref 4.0–10.5)
nRBC: 0 % (ref 0.0–0.2)

## 2022-09-14 LAB — COMPREHENSIVE METABOLIC PANEL
ALT: 17 U/L (ref 0–44)
AST: 19 U/L (ref 15–41)
Albumin: 3.7 g/dL (ref 3.5–5.0)
Alkaline Phosphatase: 62 U/L (ref 38–126)
Anion gap: 9 (ref 5–15)
BUN: 17 mg/dL (ref 8–23)
CO2: 25 mmol/L (ref 22–32)
Calcium: 8.9 mg/dL (ref 8.9–10.3)
Chloride: 106 mmol/L (ref 98–111)
Creatinine, Ser: 1.19 mg/dL (ref 0.61–1.24)
GFR, Estimated: >60 mL/min (ref >60)
Glucose, Bld: 115 mg/dL — ABNORMAL HIGH (ref 70–99)
Potassium: 3.9 mmol/L (ref 3.5–5.1)
Sodium: 140 mmol/L (ref 135–145)
Total Bilirubin: 1.1 mg/dL (ref 0.3–1.2)
Total Protein: 6.4 g/dL — ABNORMAL LOW (ref 6.5–8.1)

## 2022-09-14 LAB — SALICYLATE LEVEL: Salicylate Lvl: <7 mg/dL — ABNORMAL LOW (ref 7.0–30.0)

## 2022-09-14 LAB — ACETAMINOPHEN LEVEL: Acetaminophen (Tylenol), Serum: <10 ug/mL — ABNORMAL LOW (ref 10–30)

## 2022-09-14 LAB — ETHANOL: Alcohol, Ethyl (B): <10 mg/dL (ref <10)

## 2022-09-14 NOTE — ED Provider Notes (Signed)
[facility] EMERGENCY DEPARTMENT Provider Note   CSN: [id number] Arrival date & time: 09/14/22  1431     History  Chief Complaint  Patient presents with   Suicidal    Glenn Barnett. is a 71 y.o. male.  Patient presents for assessment from behavioral health urgent care.  Patient's had worsening depressive thoughts and today he was considering running out in the front of a car.  He has improved since then but admits he has been more down lately.  Patient has been living in the car.  Patient does not have good relationship with his son-in-law which makes it difficult to spend time with his daughter.  Patient said he has been unable to find housing or shelter.  Patient has anxiety at baseline.  Patient not taking medication for mental health.  Patient denies fevers, chills, cough, shortness of breath, chest pain or concerning headaches.       Home Medications Prior to Admission medications   Medication Sig Start Date End Date Taking? Authorizing Provider  albuterol (VENTOLIN HFA) 108 (90 Base) MCG/ACT inhaler Inhale 2 puffs into the lungs every 6 (six) hours as needed for wheezing or shortness of breath. 06/25/22   Amin, Loura Halt, MD  cholecalciferol (CHOLECALCIFEROL) 25 MCG tablet Take 1 tablet (1,000 Units total) by mouth daily. 06/25/22   Amin, Loura Halt, MD  metFORMIN (GLUCOPHAGE) 500 MG tablet Take 1 tablet (500 mg total) by mouth 2 (two) times daily with a meal. 06/21/22 09/19/22  Merwyn Katos, MD  vitamin B-12 100 MCG tablet Take 1 tablet (100 mcg total) by mouth daily. 06/25/22   Dimple Nanas, MD      Allergies    Morphine and Other    Review of Systems   Review of Systems  Constitutional:  Negative for chills and fever.  HENT:  Negative for congestion.   Eyes:  Negative for visual disturbance.  Respiratory:  Negative for shortness of breath.   Cardiovascular:  Negative for chest pain.  Gastrointestinal:  Negative for abdominal pain and  vomiting.  Genitourinary:  Negative for dysuria and flank pain.  Musculoskeletal:  Negative for back pain, neck pain and neck stiffness.  Skin:  Negative for rash.  Neurological:  Negative for light-headedness and headaches.  Psychiatric/Behavioral:  Positive for dysphoric mood and suicidal ideas.     Physical Exam Updated Vital Signs BP (!) 173/107 (BP Location: Right Arm)   Pulse 81   Temp 98.8 F (37.1 C)   Resp 16   SpO2 98%  Physical Exam Vitals and nursing note reviewed.  Constitutional:      General: He is not in acute distress.    Appearance: He is well-developed.  HENT:     Head: Normocephalic and atraumatic.     Mouth/Throat:     Mouth: Mucous membranes are moist.  Eyes:     General:        Right eye: No discharge.        Left eye: No discharge.     Conjunctiva/sclera: Conjunctivae normal.  Neck:     Trachea: No tracheal deviation.  Cardiovascular:     Rate and Rhythm: Normal rate.  Pulmonary:     Effort: Pulmonary effort is normal.  Abdominal:     General: There is no distension.  Musculoskeletal:     Cervical back: Normal range of motion.  Skin:    General: Skin is warm.     Capillary Refill: Capillary refill takes less than  2 seconds.     Findings: No rash.  Neurological:     General: No focal deficit present.     Mental Status: He is alert.     Cranial Nerves: No cranial nerve deficit.  Psychiatric:        Mood and Affect: Mood is depressed.        Behavior: Behavior is not agitated.        Thought Content: Thought content includes suicidal ideation.     ED Results / Procedures / Treatments   Labs (all labs ordered are listed, but only abnormal results are displayed) Labs Reviewed  COMPREHENSIVE METABOLIC PANEL - Abnormal; Notable for the following components:      Result Value   Glucose, Bld 115 (*)    Total Protein 6.4 (*)    All other components within normal limits  SALICYLATE LEVEL - Abnormal; Notable for the following components:    Salicylate Lvl [id number] (*)    All other components within normal limits  ACETAMINOPHEN LEVEL - Abnormal; Notable for the following components:   Acetaminophen (Tylenol), Serum <10 (*)    All other components within normal limits  ETHANOL  CBC  RAPID URINE DRUG SCREEN, HOSP PERFORMED    EKG None  Radiology No results found.  Procedures Procedures    Medications Ordered in ED Medications - No data to display  ED Course/ Medical Decision Making/ A&P                           Medical Decision Making Amount and/or Complexity of Data Reviewed Labs: ordered.   Patient currently homeless presents in need of shelter placement and worsening depression.  Patient does have suicidal ideation, does feel improved since being in the ER.  Patient says no thoughts of hurting someone else.  Screening blood work obtained normal white count, salicylate and Tylenol levels negative, electrolytes unremarkable.  Patient cooperative and comfortable, no access to weapons.  Behavioral health assessed and patient psychiatrically clear for outpatient resources.  Patient given a meal and stable for discharge at this time.        Final Clinical Impression(s) / ED Diagnoses Final diagnoses:  Suicidal ideation  Homeless    Rx / DC Orders ED Discharge Orders     None         Elnora Morrison, MD 09/14/22 1943

## 2022-09-14 NOTE — ED Notes (Signed)
Pt belongings placed in locker number 4 

## 2022-09-14 NOTE — Discharge Instructions (Signed)
Follow-up with shelter resources as provided to you. Return for new concerns.

## 2022-09-14 NOTE — Progress Notes (Signed)
TOC CSW has provided pt with shelter resources and information about [facility] and what they offer.  Glenn Barnett, MSW, LCSW-A Pronouns:  She/Her/Hers [facility] Clinical Social Worker Direct Number:  [phone] [email or web address]

## 2022-09-14 NOTE — ED Notes (Signed)
Belongings returned back to pt at time of discharge

## 2022-09-14 NOTE — ED Provider Triage Note (Signed)
Emergency Medicine Provider Triage Evaluation Note  Glenn Barnett. , a 71 y.o. male  was evaluated in triage.  Pt complains of suicidal ideations.  Patient states that he wants to run out in front of a car.  He is depressed because he is homeless and has been on and off thing for the past several years.  He sometimes struggles knowing when he is going to eat or drink.  He also has anxiety.  He is not taking any medications for mental health.  He says he tried to commit suicide a long long time ago but does not remember the details..  Review of Systems  Positive:  Negative:   Physical Exam  BP (!) 173/107 (BP Location: Right Arm)   Pulse 81   Temp 98.8 F (37.1 C)   Resp 16   SpO2 98%  Gen:   Awake, no distress   Resp:  Normal effort  MSK:   Moves extremities without difficulty  Other:    Medical Decision Making  Medically screening exam initiated at 5:30 PM.  Appropriate orders placed.  Glenn Barnett. was informed that the remainder of the evaluation will be completed by another provider, this initial triage assessment does not replace that evaluation, and the importance of remaining in the ED until their evaluation is complete.     Claudie Leach, [place] 09/14/22 1731

## 2022-09-14 NOTE — ED Provider Notes (Signed)
Behavioral Health Urgent Care Medical Screening Exam  Patient Name: Glenn Barnett. MRN: [id number] Date of Evaluation: 09/14/22 Chief Complaint: "depressed" Diagnosis:  Final diagnoses:  Need for community resource   History of Present illness: Glenn Barnett. is a 71 y.o. male. Pt presents [facility] to [facility] behavioral health for walk-in assessment. Pt is assessed face-to-face by nurse practitioner.   Glenn Barnett., 71 y.o., male patient seen face to face by this provider, and chart reviewed on 09/14/22. On evaluation when asked reason for presenting today, pt reports "depressed". Pt reports he has been feeling depressed for several months. He reports today he was sitting in his car when he had fleeting thoughts of running into traffic. He reports he has had these thoughts before. He denies intent to act on these thoughts. He denies suicidal ideation. He states "I'm not planning on doing anything. I don't want to die. I want to live". He verbally contracts to safety. He denies homicidal or violent ideations. He denies auditory visual hallucinations or paranoia.  Pt reports financial and housing stressors. He reports he is currently homeless. He reports he receives SSI, "at least $1300". He reports his next SSI does not arrive until 2 more weeks.   Pt denies history of non suicidal self injurious behavior, suicide attempt, or inpatient psychiatric hospitalization.  Pt reports marijuana use, last within last week. He denies use of cigarettes, alcohol, crack/cocaine, meth, other substances. He reports he stopped using crack/cocaine, meth, and alcohol 30 years ago.  Pt denies knowledge of family psychiatric history.   Pt denies he is receiving medication management or counseling. He reports he cannot recall if he has been on psychiatric medications before.   Discussed resources, including counseling and medication management resources. Pt verbalized understanding.    Flowsheet Row ED to Hosp-Admission (Discharged) from 06/23/2022 in [facility] ED from 06/21/2022 in [facility] ED from 06/19/2022 in [facility]  C-SSRS RISK CATEGORY No Risk No Risk No Risk      Psychiatric Specialty Exam  Presentation  General Appearance:Casual  Eye Contact:Fair  Speech:Clear and Coherent  Speech Volume:Normal  Handedness:Right   Mood and Affect  Mood: Depressed  Affect: Blunt   Thought Process  Thought Processes: Coherent; Goal Directed  Descriptions of Associations:Intact  Orientation:Full (Time, Place and Person)  Thought Content:Logical    Hallucinations:None  Ideas of Reference:None  Suicidal Thoughts:No  Homicidal Thoughts:No   Sensorium  Memory:Immediate Fair  Judgment: Fair  Insight: Fair   Art therapist  Concentration: Fair  Attention Span: Fair  Recall: [employer] of Knowledge: Fair  Language: Fair   Psychomotor Activity  Psychomotor Activity: Normal   Assets  Assets: Manufacturing systems engineer; Desire for Improvement; Financial Resources/Insurance; Resilience   Sleep  Sleep: Poor  Number of hours: No data recorded  No data recorded  Physical Exam: Physical Exam Constitutional:      General: He is not in acute distress.    Appearance: He is not ill-appearing, toxic-appearing or diaphoretic.  Eyes:     General: No scleral icterus. Cardiovascular:     Rate and Rhythm: Normal rate.  Pulmonary:     Effort: Pulmonary effort is normal. No respiratory distress.  Neurological:     Mental Status: He is alert and oriented to person, place, and time.  Psychiatric:        Attention and Perception: Attention and perception normal.  Mood and Affect: Mood is depressed. Affect is blunt.        Speech: Speech normal.        Behavior: Behavior normal. Behavior is  cooperative.        Thought Content: Thought content normal.    Review of Systems  Constitutional:  Negative for chills and fever.  Respiratory:  Negative for shortness of breath.   Cardiovascular:  Negative for chest pain and palpitations.  Gastrointestinal:  Negative for abdominal pain.  Neurological:  Negative for headaches.  Psychiatric/Behavioral:  Positive for depression.    Blood pressure (!) 158/90, pulse 92, temperature 98.5 F (36.9 C), temperature source Oral, resp. rate 18, SpO2 99 %. There is no height or weight on file to calculate BMI.  Musculoskeletal: Strength & Muscle Tone: within normal limits Gait & Station: normal Patient leans: N/A  [facility] MSE Discharge Disposition for Follow up and Recommendations: Based on my evaluation the patient does not appear to have an emergency medical condition and can be discharged with resources and follow up care in outpatient services for Medication Management and Individual Therapy  Lauree Chandler, NP 09/14/2022, 3:56 PM

## 2022-09-14 NOTE — BH Assessment (Signed)
LCSW Progress Note   Per Kelle Darting, NP, this pt does not require psychiatric hospitalization at this time.  Pt is psychiatrically cleared.  Discharge instructions include address and contact information for [facility], several resources for basic mental health services, shelters, and contact information for [facility].  EDP Kelle Darting, NP, has been notified.  Hansel Starling, MSW, LCSW [facility] [phone] or [phone]

## 2022-09-14 NOTE — Discharge Instructions (Addendum)
You are being discharged and sent to:  [facility] [address], [place], [address] [phone] phone [phone] fax   Base on the information you have provided and the presenting issue, outpatient services with therapy and psychiatry have been recommended.  It is imperative that you follow through with treatment recommendations within 5-7 days from the of discharge to mitigate further risk to your safety and mental well-being. A list of referrals has been provided below to get you started.  You are not limited to the list provided.  In case of an urgent crisis, you may contact the Mobile Crisis Unit with [facility] at [phone].  Outpatient Therapy for Medicare Recipients  [facility] [address] [place], [place], [address] [phone] phone  [facility] [address] [place], [place], [address] [address] phone ([address], [employer] [address] - [place], [address], [place], [place], [employer], [address], [employer] [facility] [place], [place], [address], [place], [place], [place], Optum, T[employer], [employer], [employer], [place])  [facility] [address], [address] [place], [place], [address] [phone] phone  [facility] [address], [address] [place], [place], [address] [phone] phone  [facility] [address] [place], [place], [address] [phone] phone [phone] fax  [facility], [place] [address], [place], [address] [phone] phone  [facility] [address] [place], [place], [address] [phone] phone [phone] fax  [facility] [address] [place], [place], [address] [phone] phone  [facility]ovita Kussmaul [address] [place], [place], [address] [phone] phone  [facility] [address] [place], [place], [address] [phone] phone [phone] fax   Shelters  [facility] [address], [place], [place] [address] [phone] Population served: Adult men & women (66 years old and older, able to perform activities for daily living) Documents required: Valid ID & Social Security Card  [facility] [address] [place], [place] [address] [phone] Population served: Males 18+ Documents required: Valid ID & Social Security Card  [facility] [address], [place], [address] [phone] phone, [phone]. [phone]  [address] [place], [place], [address]

## 2022-09-14 NOTE — Progress Notes (Signed)
[facility] Psych ED Progress Note  09/14/2022 7:20 PM Glenn Barnett.  MRN:  [id number]   Subjective:  "I was put out of the shelter" Principal Problem: Homelessness Diagnosis:  Principal Problem:   Homelessness Active Problems:   Depression   Passive suicidal ideations   ED Assessment Time Calculation: Start Time: 1850 Stop Time: 1920 Total Time in Minutes (Assessment Completion): 30  Glenn Barnett. is a 71 y.o. male, with a history of depression, homelessness, passive suicidal ideations, presents voluntarily , transported by Event organiser to [facility] emergency department.   Glenn Barnett., 71 y.o., male patient seen face to face at [facility] emergency department, per TTS consult, for psychiatric evaluation.  Patient was seen earlier today at [facility].  Patient reports that he was sleeping in the parking lot at [facility] and was told that he can no longer sleep in his car.  He presented to [facility] in initially for suicidal ideations however once further evaluated by the mental health provider it was further determined that patient was looking for housing/shelter resources. Patient was able to contract for safety, provided mental health and shelter resources and was subsequently discharged.  Patient reports presenting here to the [facility] ED after walking up to what he thought was a shelter and requesting housing.  He reports being told he was unable to stay at the location and the police subsequently were called and brought patient here for medical and psychiatric eval.  Patient endorses movement [place] to be closer to his daughter however he is unable to live with his daughter due to his son-in-law and he does not get along.  He has been living in shelters and at motels over the last few months.  Most recently he has been sleeping in his car after he was dismissed from the [facility] for eating in the designated sleeping area.  He  reports financial resources of approximately $1300 a month which he will receive his check in approximately 2 weeks.  He denies wanting to commit suicide however reports that at times he no longer wants to live.  He denies any previous attempts of suicide and endorses a history of chronic depression expanding over his lifetime.  He has never been psychiatrically admitted.  He has never been prescribed medications for mental health.   During evaluation Glenn Barnett. is sitting upright eating crackers in no acute distress. He is alert, oriented x 4, calm, cooperative and attentive. His mood is euthymic  with congruent affect.  He has normal speech, and behavior.  Objectively there is no evidence of psychosis/mania or delusional thinking.  Patient is able to converse coherently, goal directed thoughts, no distractibility, or pre-occupation. She also denies suicidal/self-harm/homicidal ideation, psychosis, and paranoia.  Patient answered question appropriately. Patient is able to contract for safety but asks where he will live for the next two weeks. TOC consult will be placed to assist with resources.   Encounter note from [facility] 09/14/2022 By this provider, and chart reviewed on 09/14/22. On evaluation when asked reason for presenting today, pt reports "depressed". Pt reports he has been feeling depressed for several months. He reports today he was sitting in his car when he had fleeting thoughts of running into traffic. He reports he has had these thoughts before. He denies intent to act on these thoughts. He denies suicidal ideation. He states "I'm not planning on doing anything. I don't want to die. I want to  live". He verbally contracts to safety. He denies homicidal or violent ideations. He denies auditory visual hallucinations or paranoia.   [place] Scale:  Flowsheet Row ED from 09/14/2022 in [facility] EMERGENCY DEPARTMENT ED to Hosp-Admission (Discharged) from 06/23/2022 in  [facility] (1A) ED from 06/21/2022 in [facility]  C-SSRS RISK CATEGORY Moderate Risk No Risk No Risk       Past Medical History:  Past Medical History:  Diagnosis Date   Brain aneurysm    Diabetes mellitus without complication (HCC)     Social History:  Social History   Substance and Sexual Activity  Alcohol Use Not Currently     Social History   Substance and Sexual Activity  Drug Use Not Currently    Social History   Socioeconomic History   Marital status: Divorced    Spouse name: Not on file   Number of children: Not on file   Years of education: Not on file   Highest education level: Not on file  Occupational History   Not on file  Tobacco Use   Smoking status: Never   Smokeless tobacco: Never  Substance and Sexual Activity   Alcohol use: Not Currently   Drug use: Not Currently   Sexual activity: Not on file  Other Topics Concern   Not on file  Social History Narrative   Not on file   Social Determinants of Health   Financial Resource Strain: Not on file  Food Insecurity: Not on file  Transportation Needs: Not on file  Physical Activity: Not on file  Stress: Not on file  Social Connections: Not on file    Sleep:  sleeping in car for several days,not obtaining appropriate sleep  Appetite:  Good  Current Medications: No current facility-administered medications for this encounter.   Current Outpatient Medications  Medication Sig Dispense Refill   albuterol (VENTOLIN HFA) 108 (90 Base) MCG/ACT inhaler Inhale 2 puffs into the lungs every 6 (six) hours as needed for wheezing or shortness of breath. 8 g 2   cholecalciferol (CHOLECALCIFEROL) 25 MCG tablet Take 1 tablet (1,000 Units total) by mouth daily. 30 tablet 0   metFORMIN (GLUCOPHAGE) 500 MG tablet Take 1 tablet (500 mg total) by mouth 2 (two) times daily with a meal. 60 tablet 2   vitamin B-12 100 MCG tablet Take 1 tablet  (100 mcg total) by mouth daily. 30 tablet 0    Lab Results:  Results for orders placed or performed during the hospital encounter of 09/14/22 (from the past 48 hour(s))  Comprehensive metabolic panel     Status: Abnormal   Collection Time: 09/14/22  5:21 PM  Result Value Ref Range   Sodium 140 135 - 145 mmol/L   Potassium 3.9 3.5 - 5.1 mmol/L   Chloride 106 98 - 111 mmol/L   CO2 25 22 - 32 mmol/L   Glucose, Bld 115 (H) 70 - 99 mg/dL    Comment: Glucose reference range applies only to samples taken after fasting for at least 8 hours.   BUN 17 8 - 23 mg/dL   Creatinine, Ser 2.69 0.61 - 1.24 mg/dL   Calcium 8.9 8.9 - 48.5 mg/dL   Total Protein 6.4 (L) 6.5 - 8.1 g/dL   Albumin 3.7 3.5 - 5.0 g/dL   AST 19 15 - 41 U/L   ALT 17 0 - 44 U/L   Alkaline Phosphatase 62 38 - 126 U/L   Total Bilirubin 1.1  0.3 - 1.2 mg/dL   GFR, Estimated >60 >60 mL/min    Comment: (NOTE) Calculated using the CKD-EPI Creatinine Equation (2021)    Anion gap 9 5 - 15    Comment: Performed at [facility] [address], [place], [place] [address]  Ethanol     Status: None   Collection Time: 09/14/22  5:21 PM  Result Value Ref Range   Alcohol, Ethyl (B) <10 <10 mg/dL    Comment: (NOTE) Lowest detectable limit for serum alcohol is 10 mg/dL.  For medical purposes only. Performed at [facility], [place] [address], [place], [place] [id number]   Salicylate level     Status: Abnormal   Collection Time: 09/14/22  5:21 PM  Result Value Ref Range   Salicylate Lvl Q000111Q (L) 7.0 - 30.0 mg/dL    Comment: Performed at [facility] [address] [place], [place] [address]  Acetaminophen level     Status: Abnormal   Collection Time: 09/14/22  5:21 PM  Result Value Ref Range   Acetaminophen (Tylenol), Serum <10 (L) 10 - 30 ug/mL    Comment: (NOTE) Therapeutic concentrations vary significantly. A range of 10-30 ug/mL  may be an effective concentration for many patients. However, some  are best  treated at concentrations outside of this range. Acetaminophen concentrations >150 ug/mL at 4 hours after ingestion  and >50 ug/mL at 12 hours after ingestion are often associated with  toxic reactions.  Performed at [facility], [place] [address] [place], [place] [address]   cbc     Status: None   Collection Time: 09/14/22  5:21 PM  Result Value Ref Range   WBC 10.3 4.0 - 10.5 K/uL   RBC 5.28 4.22 - 5.81 MIL/uL   Hemoglobin 15.7 13.0 - 17.0 g/dL   HCT 47.9 39.0 - 52.0 %   MCV 90.7 80.0 - 100.0 fL   MCH 29.7 26.0 - 34.0 pg   MCHC 32.8 30.0 - 36.0 g/dL   RDW 13.7 11.5 - 15.5 %   Platelets 310 150 - 400 K/uL   nRBC 0.0 0.0 - 0.2 %    Comment: Performed at [facility], [place] [address] [place], [place] [address]    Blood Alcohol level:  Lab Results  Component Value Date   [facility] <10 09/14/2022   ETH <10 04/28/2022    Physical Findings:   Psychiatric Specialty Exam:  Presentation  General Appearance:  Casual  Eye Contact: Fair  Speech: Clear and Coherent  Speech Volume: Normal  Handedness: Right   Mood and Affect  Mood: Depressed  Affect: Blunt   Thought Process  Thought Processes: Coherent; Goal Directed  Descriptions of Associations:Intact  Orientation:Full (Time, Place and Person)  Thought Content:Logical    Ideas of Reference:None  Suicidal Thoughts:Suicidal Thoughts: Passive SI without intent   Homicidal Thoughts:Homicidal Thoughts: No   Sensorium  Memory: Immediate Fair  Judgment: Fair  Insight: Fair   Community education officer  Concentration: Fair  Attention Span: Fair  Recall: [employer] of Knowledge: Fair  Language: Fair   Psychomotor Activity  Psychomotor Activity: Psychomotor Activity: Normal   Assets  Assets: Communication Skills; Desire for Improvement; Financial Resources/Insurance; Resilience   Sleep  Sleep: Sleep: Poor    Physical Exam: Physical Exam Vitals reviewed.   Constitutional:      Appearance: Normal appearance.  HENT:     Head: Normocephalic.  Eyes:     Extraocular Movements: Extraocular movements  intact.     Pupils: Pupils are equal, round, and reactive to light.  Cardiovascular:     Rate and Rhythm: Normal rate.  Pulmonary:     Effort: Pulmonary effort is normal.  Musculoskeletal:     Cervical back: Normal range of motion.  Neurological:     General: No focal deficit present.     Mental Status: He is alert.    Review of Systems  Psychiatric/Behavioral:  Positive for suicidal ideas.        Depressed and stressed due to lack of stable housing    Blood pressure (!) 173/107, pulse 81, temperature 98.8 F (37.1 C), resp. rate 16, SpO2 98 %. There is no height or weight on file to calculate BMI.   Medical Decision Making: Patient case review and discussed with Dr. Dwyane Dee, patient does not meet inpatient criteria for inpatient psychiatric treatment. Patient is unable to contract for safety at this time, and admits to needing shelter resources until he receives his SSI check. Educated on the appropriate use of the ED.  Evidence indicates that subsequent suicide attempts by patients who made contingent suicide threats (defined as threatened suicide or exaggerated suicidality) are uncommon in both groups. Hospitalization should not be used as a substitute for social services, substance abuse treatment, and legal assistance for patients who make contingent suicide threats (Characteristics and six-month outcome of patients who use suicide threats to seek hospital admission. (1996). Psychiatric Services, 47(8), [phone]. Doi:10.1176/ps.47.8.871)  EDP, RN, notified of disposition.    Molli Barrows, FNP-C, PMHNP-BC  09/14/2022, 7:20 PM

## 2022-09-14 NOTE — Progress Notes (Signed)
   09/14/22 1218  [facility] Triage Screening (Walk-ins at [facility] only)  How Did You Hear About [place]? Legal System  What Is the Reason for Your Visit/Call Today? Pt presents to [facility] voluntarily escorted by [employer]-[facility] due to SI with a plan to jump into traffic. Pt reports he is homeless at the moment and he has been living in his car for the past 6 months.  How Long Has This Been Causing You Problems? <Week  Have You Recently Had Any Thoughts About Hurting Yourself? Yes  How long ago did you have thoughts about hurting yourself? today  Are You Planning to Commit Suicide/Harm Yourself At This time? Yes  Have you Recently Had Thoughts About Hurting Someone Karolee Ohs? No  Are You Planning To Harm Someone At This Time? No  Are you currently experiencing any auditory, visual or other hallucinations? No  Have You Used Any Alcohol or Drugs in the Past 24 Hours? No  Do you have any current medical co-morbidities that require immediate attention? No  What Do You Feel Would Help You the Most Today? Treatment for Depression or other mood problem  If access to [facility] Urgent Care was not available, would you have sought care in the Emergency Department? No  Determination of Need Urgent (48 hours)  Options For Referral Outpatient Therapy;Medication Management;BH Urgent Care

## 2022-09-14 NOTE — ED Triage Notes (Signed)
Patent here with complaint of being sad and suicidal. Patient states he is homeless and has been sleeping in his car but now his car won't start and the parking lot he was parking in has threatened to tow his vehicle. Patient states he thinks he would walk in front of a car but has not done so because he does not want to put someone else through the distress of killing him. Patient states he did attempt to kill himself a long time ago but does not recall what he actually did. Patient is alert, oriented, and in no apparent distress at this time.

## 2022-09-15 ENCOUNTER — Other Ambulatory Visit: Payer: Self-pay

## 2022-09-15 ENCOUNTER — Encounter (HOSPITAL_COMMUNITY): Payer: Self-pay

## 2022-09-15 ENCOUNTER — Emergency Department (HOSPITAL_COMMUNITY)
Admission: EM | Admit: 2022-09-15 | Discharge: 2022-09-15 | Disposition: A | Discharge status: Home / Self Care | Payer: Medicare Other | Attending: Emergency Medicine | Admitting: Emergency Medicine

## 2022-09-15 ENCOUNTER — Emergency Department (EMERGENCY_DEPARTMENT_HOSPITAL)
Admission: EM | Admit: 2022-09-15 | Discharge: 2022-09-15 | Disposition: A | Discharge status: Other Acute Inpatient Hospital | Payer: Medicare Other | Source: Home / Self Care | Attending: Emergency Medicine | Admitting: Emergency Medicine

## 2022-09-15 DIAGNOSIS — F32A Depression, unspecified: Secondary | ICD-10-CM | POA: Diagnosis present

## 2022-09-15 DIAGNOSIS — Z7901 Long term (current) use of anticoagulants: Secondary | ICD-10-CM | POA: Insufficient documentation

## 2022-09-15 DIAGNOSIS — E119 Type 2 diabetes mellitus without complications: Secondary | ICD-10-CM | POA: Insufficient documentation

## 2022-09-15 DIAGNOSIS — R45851 Suicidal ideations: Secondary | ICD-10-CM | POA: Insufficient documentation

## 2022-09-15 DIAGNOSIS — Z1152 Encounter for screening for COVID-19: Secondary | ICD-10-CM | POA: Insufficient documentation

## 2022-09-15 DIAGNOSIS — F331 Major depressive disorder, recurrent, moderate: Secondary | ICD-10-CM | POA: Diagnosis not present

## 2022-09-15 DIAGNOSIS — Z5900 Homelessness unspecified: Secondary | ICD-10-CM | POA: Insufficient documentation

## 2022-09-15 DIAGNOSIS — Z7984 Long term (current) use of oral hypoglycemic drugs: Secondary | ICD-10-CM | POA: Insufficient documentation

## 2022-09-15 DIAGNOSIS — F330 Major depressive disorder, recurrent, mild: Secondary | ICD-10-CM | POA: Diagnosis not present

## 2022-09-15 LAB — CBC WITH DIFFERENTIAL/PLATELET
Abs Immature Granulocytes: 0.02 10*3/uL (ref 0.00–0.07)
Basophils Absolute: 0 10*3/uL (ref 0.0–0.1)
Basophils Relative: 0 %
Eosinophils Absolute: 0.1 10*3/uL (ref 0.0–0.5)
Eosinophils Relative: 2 %
HCT: 47.2 % (ref 39.0–52.0)
Hemoglobin: 14.9 g/dL (ref 13.0–17.0)
Immature Granulocytes: 0 %
Lymphocytes Relative: 22 %
Lymphs Abs: 2 10*3/uL (ref 0.7–4.0)
MCH: 29.4 pg (ref 26.0–34.0)
MCHC: 31.6 g/dL (ref 30.0–36.0)
MCV: 93.3 fL (ref 80.0–100.0)
Monocytes Absolute: 1 10*3/uL (ref 0.1–1.0)
Monocytes Relative: 11 %
Neutro Abs: 5.9 10*3/uL (ref 1.7–7.7)
Neutrophils Relative %: 65 %
Platelets: 269 10*3/uL (ref 150–400)
RBC: 5.06 MIL/uL (ref 4.22–5.81)
RDW: 13.7 % (ref 11.5–15.5)
WBC: 9.1 10*3/uL (ref 4.0–10.5)
nRBC: 0 % (ref 0.0–0.2)

## 2022-09-15 LAB — COMPREHENSIVE METABOLIC PANEL
ALT: 14 U/L (ref 0–44)
AST: 17 U/L (ref 15–41)
Albumin: 3.2 g/dL — ABNORMAL LOW (ref 3.5–5.0)
Alkaline Phosphatase: 57 U/L (ref 38–126)
Anion gap: 7 (ref 5–15)
BUN: 19 mg/dL (ref 8–23)
CO2: 25 mmol/L (ref 22–32)
Calcium: 8.6 mg/dL — ABNORMAL LOW (ref 8.9–10.3)
Chloride: 106 mmol/L (ref 98–111)
Creatinine, Ser: 1.24 mg/dL (ref 0.61–1.24)
GFR, Estimated: >60 mL/min (ref >60)
Glucose, Bld: 124 mg/dL — ABNORMAL HIGH (ref 70–99)
Potassium: 3.8 mmol/L (ref 3.5–5.1)
Sodium: 138 mmol/L (ref 135–145)
Total Bilirubin: 1.2 mg/dL (ref 0.3–1.2)
Total Protein: 6.5 g/dL (ref 6.5–8.1)

## 2022-09-15 LAB — SARS CORONAVIRUS 2 BY RT PCR: SARS Coronavirus 2 by RT PCR: NEGATIVE

## 2022-09-15 NOTE — ED Triage Notes (Signed)
Patient was brought in by Crisis Team/[employer].  Patient was seen at [facility] yesterday. Patient was taken to [facility] today so he could wait and get a hotel stay for 3 days, but when he realized he would be sleeping outside and not have access to food the patient stated he could not do that and stated that he would kill himself by jumping in front of a moving car.

## 2022-09-15 NOTE — ED Notes (Signed)
PO challenge was successful   patient can walk and move around on his own with out assistance

## 2022-09-15 NOTE — ED Notes (Signed)
Handoff given to receiving RN. Safe transport called.

## 2022-09-15 NOTE — ED Notes (Signed)
Attempted to call report. [facility] will call back.

## 2022-09-15 NOTE — Consult Note (Cosign Needed Addendum)
BH ED ASSESSMENT   Reason for Consult:  Suicidal Ideation Referring Physician:  Sherian Maroonooper Robbins, PA Patient Identification: Glenn Nayheodore Copher Jr. MRN:  [id number] ED Chief Complaint: Depression  Diagnosis:  Principal Problem:   Depression Active Problems:   Passive suicidal ideations   Homelessness   ED Assessment Time Calculation: Start Time: 1745 Stop Time: 1810 Total Time in Minutes (Assessment Completion): 25   Subjective:   Glenn Nayheodore Troia Jr. 71 y.o., male [facility] with complaints of depression and feeling hopeless.   HPI: Glenn Nayheodore Vanderschaaf Jr., 71 y.o., male patient seen face to face by this provider, chart reviewed on 09/15/22.  On evaluation Glenn Nayheodore Turton Jr. reports that he went to [facility] this morning and they told him that the facility would be able to assist him on Monday 09/18/22 and place him in a hotel for about 3 days, and during that time they should be able to find housing for him next week. Patient stated he felt hopeless, he doesn't want to continue to live in his car, he says that while living in his car he continues to become depressed and having episodic SI thoughts of driving into traffic. Patient says he has a diagnosis of bipolar and anxiety, but he hasn't taken medications for his diagnosis in a long time. 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 Patient says he receives about $1300 a month and he pays for his car payment and car insurance and medicaid and he can't afford housing and he wants to keep his car. Patient says he has not had a permanent address in a long time, he just needs help in finding a place to stay.   During evaluation Glenn Nayheodore Chieffo Jr. is sitting in his bed in no acute distress. He is  alert, oriented x 4, calm, cooperative and attentive. His mood is euthymic with congruent affect. He has normal speech, and behavior.  Objectively there is no evidence of psychosis/mania or delusional thinking.  Patient is able to converse coherently, goal directed thoughts, no distractibility, or pre-occupation. He currently denies suicidal/self-harm/homicidal ideation, psychosis, and paranoia.  Patient answered question appropriately.    At this time Glenn Nayheodore Schweizer Jr. is educated and verbalizes understanding of mental health resources and other crisis services in the community.  Past Psychiatric History: Diagnosis of Anxiety and Bipolar  Risk to Self or Others: Is the patient at risk to self? No Has the patient been a risk to self in the past 6 months? No Has the patient been a risk to self within the distant past? No Is the patient a risk to others? No Has the patient been a risk to others in the past 6 months? No Has the patient been a risk to others within the distant past? No  [place] Scale:  Flowsheet Row ED from 09[facility]15/2022 [facility] Most recent reading at 09/15/2022  4:44 PM [facility] Most recent reading at 09/15/2022  8:35 AM [facility] Most recent reading at 09/14/2022  5:22 PM  C-SSRS RISK CATEGORY High Risk Error: Q3, 4, or 5 should not be populated when Q2 is No Moderate  Risk       AIMS:  , , ,  ,   ASAM:    Substance Abuse:     Past Medical History:  Past Medical History:  Diagnosis Date   Brain aneurysm    Diabetes mellitus without complication (HCC)     Past Surgical History:  Procedure Laterality Date   BRAIN SURGERY     Social History:  Social History   Substance and Sexual Activity  Alcohol Use Not Currently     Social History   Substance and Sexual Activity  Drug Use Not Currently    Social  History   Socioeconomic History   Marital status: Divorced    Spouse name: Not on file   Number of children: Not on file   Years of education: Not on file   Highest education level: Not on file  Occupational History   Not on file  Tobacco Use   Smoking status: Never   Smokeless tobacco: Never  Vaping Use   Vaping Use: Never used  Substance and Sexual Activity   Alcohol use: Not Currently   Drug use: Not Currently   Sexual activity: Not on file  Other Topics Concern   Not on file  Social History Narrative   Not on file   Social Determinants of Health   Financial Resource Strain: Not on file  Food Insecurity: Not on file  Transportation Needs: Not on file  Physical Activity: Not on file  Stress: Not on file  Social Connections: Not on file    Allergies:   Allergies  Allergen Reactions   Morphine     Other reaction(s): Other (See Comments), Other (See Comments) Recovering drug addict Recovering drug addict    Other Other (See Comments)    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    Labs:  Results for orders placed or performed during the hospital encounter of 09/14/22 (from the past 48 hour(s))  Comprehensive metabolic panel     Status: Abnormal   Collection Time: 09/14/22  5:21 PM  Result Value Ref Range   Sodium 140 135 - 145 mmol/L   Potassium 3.9 3.5 - 5.1 mmol/L   Chloride 106 98 - 111 mmol/L   CO2 25 22 - 32 mmol/L   Glucose, Bld 115 (H) 70 - 99 mg/dL    Comment: Glucose reference range applies only to samples taken after fasting for at least 8 hours.   BUN 17 8 - 23 mg/dL   Creatinine, Ser 9.24 0.61 - 1.24 mg/dL   Calcium 8.9 8.9 - 26.8 mg/dL   Total Protein 6.4 (L) 6.5 - 8.1 g/dL   Albumin 3.7 3.5 - 5.0 g/dL   AST 19 15 - 41 U/L   ALT 17 0 - 44 U/L   Alkaline Phosphatase 62 38 - 126 U/L   Total Bilirubin 1.1 0.3 - 1.2 mg/dL   GFR, Estimated >34 >19 mL/min    Comment: (NOTE) Calculated using the CKD-EPI Creatinine Equation  (2021)    Anion gap 9 5 - 15    Comment: Performed at [facility], [address], [place], [place] [address]  Ethanol     Status: None   Collection Time: 09/14/22  5:21 PM  Result Value Ref Range   Alcohol, Ethyl (B) <10 <10 mg/dL    Comment: (NOTE) Lowest detectable limit for serum alcohol is 10 mg/dL.  For medical purposes only. Performed at [facility], [address] [place], [place] [address]   Salicylate level     Status: Abnormal   Collection Time: 09/14/22  5:21 PM  Result Value Ref Range   Salicylate Lvl <7.0 (L) 7.0 - 30.0 mg/dL    Comment: Performed at [facility], [address], [place], [place] [address]  Acetaminophen level     Status: Abnormal   Collection Time: 09/14/22  5:21 PM  Result Value Ref Range   Acetaminophen (Tylenol), Serum <10 (L) 10 - 30 ug/mL    Comment: (NOTE) Therapeutic concentrations vary significantly. A range of 10-30 ug/mL  may be an effective concentration for many patients. However, some  are best treated at concentrations outside of this range. Acetaminophen concentrations >150 ug/mL at 4 hours after ingestion  and >50 ug/mL at 12 hours after ingestion are often associated with  toxic reactions.  Performed at [facility], [address] [place], [place] [address]   cbc     Status: None   Collection Time: 09/14/22  5:21 PM  Result Value Ref Range   WBC 10.3 4.0 - 10.5 K/uL   RBC 5.28 4.22 - 5.81 MIL/uL   Hemoglobin 15.7 13.0 - 17.0 g/dL   HCT 50.9 32.6 - 71.2 %   MCV 90.7 80.0 - 100.0 fL   MCH 29.7 26.0 - 34.0 pg   MCHC 32.8 30.0 - 36.0 g/dL   RDW 45.8 09.9 - 83.3 %   Platelets 310 150 - 400 K/uL   nRBC 0.0 0.0 - 0.2 %    Comment: Performed at [facility], [address] [place], [place] [address]    No current facility-administered medications for this encounter.   Current Outpatient Medications  Medication Sig Dispense Refill   albuterol (VENTOLIN HFA) 108 (90 Base) MCG/ACT inhaler  Inhale 2 puffs into the lungs every 6 (six) hours as needed for wheezing or shortness of breath. 8 g 2   cholecalciferol (CHOLECALCIFEROL) 25 MCG tablet Take 1 tablet (1,000 Units total) by mouth daily. 30 tablet 0   metFORMIN (GLUCOPHAGE) 500 MG tablet Take 1 tablet (500 mg total) by mouth 2 (two) times daily with a meal. 60 tablet 2   vitamin B-12 100 MCG tablet Take 1 tablet (100 mcg total) by mouth daily. 30 tablet 0    Musculoskeletal: Strength & Muscle Tone: within normal limits Gait & Station: normal Patient leans: N/A   Psychiatric Specialty Exam: Presentation  General Appearance:  Appropriate for Environment  Eye Contact: Good  Speech: Clear and Coherent  Speech Volume: Normal  Handedness: Right   Mood and Affect  Mood: Depressed  Affect: Blunt; Flat   Thought Process  Thought Processes: Coherent  Descriptions of Associations:Intact  Orientation:Full (Time, Place and Person)  Thought Content:WDL  History of Schizophrenia/Schizoaffective disorder:No data recorded Duration of Psychotic Symptoms:No data recorded Hallucinations:Hallucinations: None  Ideas of Reference:None  Suicidal Thoughts:Suicidal Thoughts: Yes, Passive SI Passive Intent and/or Plan: With Plan  Homicidal Thoughts:Homicidal Thoughts: No   Sensorium  Memory: Immediate Fair  Judgment: Fair  Insight: Fair   Executive Functions  Concentration: Good  Attention Span: Good  Recall: Good  Fund of Knowledge: Good  Language: Fair   Psychomotor Activity  Psychomotor Activity: Psychomotor Activity: Normal   Assets  Assets: Transportation; Desire for Improvement    Sleep  Sleep: Sleep: Fair   Physical Exam: Physical Exam Vitals and nursing note reviewed.  Constitutional:      Appearance: Normal appearance.  HENT:  Head: Normocephalic.  Eyes:     Pupils: Pupils are equal, round, and reactive to light.  Pulmonary:     Effort: Pulmonary effort  is normal.  Musculoskeletal:        General: Normal range of motion.  Neurological:     Mental Status: He is alert.  Psychiatric:        Mood and Affect: Mood is depressed. Affect is flat.        Speech: Speech normal.        Behavior: Behavior normal. Behavior is cooperative.        Thought Content: Thought content includes suicidal ideation. Thought content includes suicidal plan.        Cognition and Memory: Cognition normal.        Judgment: Judgment is impulsive.    Review of Systems  Constitutional: Negative.   HENT: Negative.    Respiratory: Negative.    Psychiatric/Behavioral:  Positive for depression and suicidal ideas.    Blood pressure (!) 153/98, pulse 78, temperature 98.7 F (37.1 C), temperature source Oral, resp. rate 16, height 6\' 4"  (1.93 m), weight 111.1 kg, SpO2 99 %. Body mass index is 29.82 kg/m.  Medical Decision Making: . 71 yr old male, patient presented to [facility] with complaints of depression and feeling hopeless. Patient making suicidal statements that if he is discharged he will take his car and drive into traffic, due to having to live in his car again when all he is trying to do is get help.   Disposition:  Recommend continuous assessment at [facility] and he has been accepted by [facility], PMHNP.  Doran Heater, PMHNP 09/15/2022 6:16 PM

## 2022-09-15 NOTE — ED Provider Notes (Signed)
[facility] EMERGENCY DEPARTMENT Provider Note   CSN: [id number] Arrival date & time: 09/15/22  1914     History  Chief Complaint  Patient presents with   Homeless    Glenn Clinard. is a 71 y.o. male with depression, homelessness presents with homelessness.   Patient here for homelessness, seen for same yesterday. Presented from [facility] urgent care and was discharged to a shelter but states when he got there he was told he did not qualify for this assistance due to owning a car and having social security income. Patient returns requesting assistance with finding shelter. He states, "I do not need any more medical treatment, just wondering if I can get some help finding another shelter." Denies any new symptoms, CP, SOB.   HPI     Home Medications Prior to Admission medications   Medication Sig Start Date End Date Taking? Authorizing Provider  albuterol (VENTOLIN HFA) 108 (90 Base) MCG/ACT inhaler Inhale 2 puffs into the lungs every 6 (six) hours as needed for wheezing or shortness of breath. 06/25/22   Amin, Loura Halt, MD  cholecalciferol (CHOLECALCIFEROL) 25 MCG tablet Take 1 tablet (1,000 Units total) by mouth daily. 06/25/22   Amin, Loura Halt, MD  metFORMIN (GLUCOPHAGE) 500 MG tablet Take 1 tablet (500 mg total) by mouth 2 (two) times daily with a meal. 06/21/22 09/19/22  Merwyn Katos, MD  vitamin B-12 100 MCG tablet Take 1 tablet (100 mcg total) by mouth daily. 06/25/22   Dimple Nanas, MD      Allergies    Morphine and Other    Review of Systems   Review of Systems Review of systems Negative for f/c.  A 10 point review of systems was performed and is negative unless otherwise reported in HPI.  Physical Exam Updated Vital Signs BP (!) 162/89 (BP Location: Right Arm)   Pulse 75   Temp 98.9 F (37.2 C)   Resp 16   SpO2 98%  Physical Exam General: Normal appearing male, lying in bed.  HEENT: PERRLA, Sclera anicteric, MMM, trachea midline.   Cardiology: RRR, no murmurs/rubs/gallops. BL radial and DP pulses equal bilaterally.  Resp: Normal respiratory rate and effort. CTAB, no wheezes, rhonchi, crackles.  Abd: Soft, non-tender, non-distended. No rebound tenderness or guarding.  GU: Deferred. MSK: No peripheral edema or signs of trauma. Extremities without deformity or TTP. No cyanosis or clubbing. Skin: warm, dry. No rashes or lesions. Back: No CVA tenderness Neuro: A&Ox4, CNs II-XII grossly intact. MAEs. Sensation grossly intact.  Psych: Normal mood and affect.   ED Results / Procedures / Treatments   Procedures Procedures    Medications Ordered in ED Medications - No data to display  ED Course/ Medical Decision Making/ A&P                          Medical Decision Making    This patient presents to the ED for concern of homelessness and shelter resources. He is consulted to social work and given resources.   MDM:    Patient was seen yesterday and had depressive thoughts and difficulty finding shelter. Yesterday he reported passive SI with no plan, and no HI/AH/VH. He had screening blood work obtained yesterday that was unremarkable. No access to weapons per documentation.   Patient today is well-appearing and HDS, afebrile. He denies needing any medical assistance at this time. Has no requests except shelter help.  Patient is consulted to social  work who will come to discuss resources with patient. I have also placed social services and homeless shelter resource guides in patient's DC paperwork.     Additional history obtained from chart review.   Social Determinants of Health: Patient is homeless at this time  Disposition:  DC w/ resources from social work, resources guides for social assistance and shelters  Co morbidities that complicate the patient evaluation  Past Medical History:  Diagnosis Date   Brain aneurysm    Diabetes mellitus without complication (HCC)      Medicines No orders of the  defined types were placed in this encounter.   I have reviewed the patients home medicines and have made adjustments as needed  Problem List / ED Course: Problem List Items Addressed This Visit       Other   Homelessness - Primary               This note was created using dictation software, which may contain spelling or grammatical errors.    Loetta Rough, MD 09/15/22 [phone]

## 2022-09-15 NOTE — Discharge Instructions (Addendum)
Thank you for coming to [facility] Emergency Department. You were seen for shelter resources. We consulted social work who came to chat with you. We have also provided social services and shelter resource guides in this paperwork.   Do not hesitate to return to the ED or call 911 if you experience: -New or concerning symptoms -Lightheadedness, passing out -Fevers/chills -Anything else that concerns you

## 2022-09-15 NOTE — ED Triage Notes (Signed)
Patient here for homelessness, seen for same yesterday and was discharged to a shelter but states when he got there he was told he did not qualify for this assistance due to owning a car and having social security income. Patient returns requesting assistance with finding shelter.

## 2022-09-15 NOTE — ED Notes (Signed)
Attempted to call report x2

## 2022-09-15 NOTE — ED Notes (Signed)
Attempted to call report. Will try again.

## 2022-09-15 NOTE — ED Provider Notes (Signed)
[name] Provider Note   CSN: [id number] Arrival date & time: 09/15/22  1548     History  Chief Complaint  Patient presents with   Suicidal    Glenn Barnett. is a 71 y.o. male.  HPI   71 year old male presents emergency department with complaints of homelessness, depression and suicidal thoughts.  Patient recently seen at behavioral health urgent care yesterday and discharged due to no apparent emergency medical condition with appropriate outpatient resources.  Patient was again seen subsequently yesterday night as well as this morning.  He presents with similar complaints.  Patient attempted to go to outpatient living facility but left after being told he had to sleep in his car for at least 1 night prior to available bed.  Denies any suicidal intent, homicidal ideation, visual/auditory hallucination.  Patient reports thoughts of wanting to pull out in his car into oncoming traffic.  Denies history of suicide attempts.  This is his third time with similar presentation in the past 2 days but patient states he is becoming more fearful of potentially acting on his thoughts.  Past medical history significant for diabetes mellitus, subdural hematoma, suicidal ideation, homelessness  Home Medications Prior to Admission medications   Medication Sig Start Date End Date Taking? Authorizing Provider  albuterol (VENTOLIN HFA) 108 (90 Base) MCG/ACT inhaler Inhale 2 puffs into the lungs every 6 (six) hours as needed for wheezing or shortness of breath. 06/25/22   Amin, Loura Halt, MD  cholecalciferol (CHOLECALCIFEROL) 25 MCG tablet Take 1 tablet (1,000 Units total) by mouth daily. 06/25/22   Amin, Loura Halt, MD  metFORMIN (GLUCOPHAGE) 500 MG tablet Take 1 tablet (500 mg total) by mouth 2 (two) times daily with a meal. 06/21/22 09/19/22  Merwyn Katos, MD  vitamin B-12 100 MCG tablet Take 1 tablet (100 mcg total) by mouth daily. 06/25/22   Dimple Nanas, MD      Allergies    Morphine and Other    Review of Systems   Review of Systems  All other systems reviewed and are negative.   Physical Exam Updated Vital Signs BP (!) 153/98   Pulse 78   Temp 98.7 F (37.1 C) (Oral)   Resp 16   Ht 6\' 4"  (1.93 m)   Wt 111.1 kg   SpO2 99%   BMI 29.82 kg/m  Physical Exam Vitals and nursing note reviewed.  Constitutional:      General: He is not in acute distress.    Appearance: He is well-developed.  HENT:     Head: Normocephalic and atraumatic.  Eyes:     Conjunctiva/sclera: Conjunctivae normal.  Cardiovascular:     Rate and Rhythm: Normal rate and regular rhythm.     Heart sounds: No murmur heard. Pulmonary:     Effort: Pulmonary effort is normal. No respiratory distress.     Breath sounds: Normal breath sounds.  Abdominal:     Palpations: Abdomen is soft.     Tenderness: There is no abdominal tenderness.  Musculoskeletal:        General: No swelling.     Cervical back: Neck supple.  Skin:    General: Skin is warm and dry.     Capillary Refill: Capillary refill takes less than 2 seconds.  Neurological:     Mental Status: He is alert.  Psychiatric:        Mood and Affect: Mood normal.     ED Results / Procedures / Treatments  Labs (all labs ordered are listed, but only abnormal results are displayed) Labs Reviewed  COMPREHENSIVE METABOLIC PANEL - Abnormal; Notable for the following components:      Result Value   Glucose, Bld 124 (*)    Calcium 8.6 (*)    Albumin 3.2 (*)    All other components within normal limits  SARS CORONAVIRUS 2 BY RT PCR  CBC WITH DIFFERENTIAL/PLATELET    EKG None  Radiology No results found.  Procedures Procedures    Medications Ordered in ED Medications - No data to display  ED Course/ Medical Decision Making/ A&P Clinical Course as of 09/15/22 1932  Fri Sep 15, 2022  1851 Behavioral health consultation and NP Arvil Persons evaluated the patient and made assessment  of outpatient behavioral closure and care pending negative COVID and a CMP.  To follow-up on laboratory studies with expectant discharge to behavioral health urgent care. [CR]    Clinical Course User Index [CR] Peter Garter, PA                           Medical Decision Making Amount and/or Complexity of Data Reviewed Labs: ordered.   This patient presents to the ED for concern of suicidal ideation, this involves an extensive number of treatment options, and is a complaint that carries with it a high risk of complications and morbidity.  The differential diagnosis includes depression, suicidal ideation, homicidal ideation, auditory/visual hallucination, alcohol/drug withdrawal/intoxication.   Co morbidities that complicate the patient evaluation  See HPI   Additional history obtained:  Additional history obtained from EMR External records from outside source obtained and reviewed including hospital records   Lab Tests:  I Ordered, and personally interpreted labs.  The pertinent results include: No leukocytosis noted.  No evidence anemia.  Platelets within range.  No electrolyte abnormalities noted.  No transaminitis.  Function within normal limits.  COVID test negative  Imaging Studies ordered:  N/a   Cardiac Monitoring: / EKG:  The patient was maintained on a cardiac monitor.  I personally viewed and interpreted the cardiac monitored which showed an underlying rhythm of: Sinus rhythm   Consultations Obtained:  I requested consultation with the CTS consultation given patient's 3 visits in the past 2 days for similar complaint.  Discussed case with attending physician Dr. Rubin Payor.  He was in agreement with plan.   Problem List / ED Course / Critical interventions / Medication management  Suicidal ideation Reevaluation of the patient showed that the patient stayed the same I have reviewed the patients home medicines and have made adjustments as needed   Social  Determinants of Health:  Denies tobacco, licit drug use.   Test / Admission - Considered:  Suicidal ideation Vitals signs significant for hypertension with blood pressure 160/94.  Recommend follow-up with primary care regarding admission blood pressure.. Otherwise within normal range and stable throughout visit. Laboratory studies significant for: See above Patient with current suicidal ideation of which she states is getting worse.  He was 3 emergency department visits in the past 2 days.  I discussed case with attending physician Dr. Rubin Payor given his multiple visits even with prior behavioral health assessment.  Deemed in the patient's most interest to have repeat behavioral health evaluation given worsening suicidal ideation.  Patient without medical complaint and no clear medical source of illness the patient deemed cleared medically for TTS consultation.  Consultation was made and patient was stable upon consultation.  After consultation  with TTS, recommended transfer to behavioral health urgent care given patient's current complaints and overall negative medical workup.  Patient deemed cleared medically for transfer to behavioral health urgent care.        Final Clinical Impression(s) / ED Diagnoses Final diagnoses:  Suicidal ideation    Rx / DC Orders ED Discharge Orders     None         Peter Garter, [place] 09/15/22 1932    Benjiman Core, MD 09/15/22 2322

## 2022-09-16 ENCOUNTER — Emergency Department (HOSPITAL_COMMUNITY)
Admission: EM | Admit: 2022-09-16 | Discharge: 2022-09-19 | Disposition: A | Discharge status: Home / Self Care | Payer: Medicare Other | Attending: Emergency Medicine | Admitting: Emergency Medicine

## 2022-09-16 ENCOUNTER — Ambulatory Visit (INDEPENDENT_AMBULATORY_CARE_PROVIDER_SITE_OTHER)
Admission: EM | Admit: 2022-09-16 | Discharge: 2022-09-16 | Disposition: A | Discharge status: Home / Self Care | Payer: Medicare Other | Source: Home / Self Care

## 2022-09-16 ENCOUNTER — Other Ambulatory Visit: Payer: Self-pay

## 2022-09-16 DIAGNOSIS — Z1339 Encounter for screening examination for other mental health and behavioral disorders: Secondary | ICD-10-CM | POA: Diagnosis not present

## 2022-09-16 DIAGNOSIS — F332 Major depressive disorder, recurrent severe without psychotic features: Secondary | ICD-10-CM | POA: Diagnosis present

## 2022-09-16 DIAGNOSIS — R45851 Suicidal ideations: Secondary | ICD-10-CM | POA: Insufficient documentation

## 2022-09-16 DIAGNOSIS — Z7984 Long term (current) use of oral hypoglycemic drugs: Secondary | ICD-10-CM | POA: Diagnosis not present

## 2022-09-16 DIAGNOSIS — F319 Bipolar disorder, unspecified: Secondary | ICD-10-CM | POA: Insufficient documentation

## 2022-09-16 DIAGNOSIS — F331 Major depressive disorder, recurrent, moderate: Secondary | ICD-10-CM | POA: Diagnosis not present

## 2022-09-16 DIAGNOSIS — E119 Type 2 diabetes mellitus without complications: Secondary | ICD-10-CM | POA: Insufficient documentation

## 2022-09-16 DIAGNOSIS — Z008 Encounter for other general examination: Secondary | ICD-10-CM

## 2022-09-16 LAB — CBC
HCT: 48.6 % (ref 39.0–52.0)
Hemoglobin: 15.3 g/dL (ref 13.0–17.0)
MCH: 29.1 pg (ref 26.0–34.0)
MCHC: 31.5 g/dL (ref 30.0–36.0)
MCV: 92.6 fL (ref 80.0–100.0)
Platelets: 273 10*3/uL (ref 150–400)
RBC: 5.25 MIL/uL (ref 4.22–5.81)
RDW: 13.4 % (ref 11.5–15.5)
WBC: 7.9 10*3/uL (ref 4.0–10.5)
nRBC: 0 % (ref 0.0–0.2)

## 2022-09-16 LAB — BASIC METABOLIC PANEL
Anion gap: 7 (ref 5–15)
BUN: 17 mg/dL (ref 8–23)
CO2: 26 mmol/L (ref 22–32)
Calcium: 9 mg/dL (ref 8.9–10.3)
Chloride: 107 mmol/L (ref 98–111)
Creatinine, Ser: 1.22 mg/dL (ref 0.61–1.24)
GFR, Estimated: >60 mL/min (ref >60)
Glucose, Bld: 120 mg/dL — ABNORMAL HIGH (ref 70–99)
Potassium: 4.1 mmol/L (ref 3.5–5.1)
Sodium: 140 mmol/L (ref 135–145)

## 2022-09-16 LAB — POCT URINE DRUG SCREEN - MANUAL ENTRY (I-SCREEN)
POC Amphetamine UR: NOT DETECTED
POC Buprenorphine (BUP): NOT DETECTED
POC Cocaine UR: NOT DETECTED
POC Marijuana UR: POSITIVE — AB
POC Methadone UR: NOT DETECTED
POC Methamphetamine UR: NOT DETECTED
POC Morphine: NOT DETECTED
POC Oxazepam (BZO): NOT DETECTED
POC Oxycodone UR: NOT DETECTED
POC Secobarbital (BAR): NOT DETECTED

## 2022-09-16 LAB — SALICYLATE LEVEL: Salicylate Lvl: <7 mg/dL — ABNORMAL LOW (ref 7.0–30.0)

## 2022-09-16 LAB — ACETAMINOPHEN LEVEL: Acetaminophen (Tylenol), Serum: <10 ug/mL — ABNORMAL LOW (ref 10–30)

## 2022-09-16 LAB — ETHANOL: Alcohol, Ethyl (B): <10 mg/dL (ref <10)

## 2022-09-16 MED ORDER — TRAZODONE HCL 100 MG PO TABS
50.0000 mg | ORAL_TABLET | Freq: Every evening | ORAL | Status: DC | PRN
  Administered 2022-09-16: 50 mg via ORAL
  Filled 2022-09-16: qty 1

## 2022-09-16 MED ORDER — METFORMIN HCL 500 MG PO TABS
500.0000 mg | ORAL_TABLET | Freq: Two times a day (BID) | ORAL | Status: DC
  Administered 2022-09-16: 500 mg via ORAL
  Filled 2022-09-16: qty 1

## 2022-09-16 MED ORDER — HYDROXYZINE HCL 25 MG PO TABS: 25.0000 mg | ORAL_TABLET | Freq: Three times a day (TID) | ORAL | Status: DC | PRN

## 2022-09-16 MED ORDER — GABAPENTIN 100 MG PO CAPS
100.0000 mg | ORAL_CAPSULE | Freq: Three times a day (TID) | ORAL | Status: DC
  Administered 2022-09-16 – 2022-09-19 (×9): 100 mg via ORAL
  Filled 2022-09-16 (×9): qty 1

## 2022-09-16 MED ORDER — MAGNESIUM HYDROXIDE 400 MG/5ML PO SUSP: 30.0000 mL | Freq: Every day | ORAL | Status: DC | PRN

## 2022-09-16 MED ORDER — ACETAMINOPHEN 325 MG PO TABS: 650.0000 mg | ORAL_TABLET | Freq: Four times a day (QID) | ORAL | Status: DC | PRN

## 2022-09-16 MED ORDER — METFORMIN HCL 500 MG PO TABS
500.0000 mg | ORAL_TABLET | Freq: Two times a day (BID) | ORAL | Status: DC
  Administered 2022-09-16 – 2022-09-19 (×6): 500 mg via ORAL
  Filled 2022-09-16 (×6): qty 1

## 2022-09-16 MED ORDER — TRAZODONE HCL 50 MG PO TABS
50.0000 mg | ORAL_TABLET | Freq: Every evening | ORAL | Status: DC | PRN
  Administered 2022-09-16: 50 mg via ORAL
  Filled 2022-09-16: qty 1

## 2022-09-16 MED ORDER — ALUM & MAG HYDROXIDE-SIMETH 200-200-20 MG/5ML PO SUSP: 30.0000 mL | ORAL | Status: DC | PRN

## 2022-09-16 MED ORDER — SERTRALINE HCL 50 MG PO TABS
25.0000 mg | ORAL_TABLET | Freq: Every day | ORAL | Status: DC
  Administered 2022-09-16 – 2022-09-18 (×3): 25 mg via ORAL
  Filled 2022-09-16 (×3): qty 1

## 2022-09-16 MED ORDER — IBUPROFEN 200 MG PO TABS: 600.0000 mg | ORAL_TABLET | Freq: Three times a day (TID) | ORAL | Status: DC | PRN

## 2022-09-16 NOTE — Consult Note (Signed)
[facility] ED ASSESSMENT   Reason for Consult:  Psychiatric evaluation Referring Physician:  ER Physician Patient Identification: Glenn Barnett. MRN:  [id number] ED Chief Complaint: Major depressive disorder, recurrent severe without psychotic features (HCC)  Diagnosis:  Principal Problem:   Major depressive disorder, recurrent severe without psychotic features (HCC) Active Problems:   Suicide ideation   ED Assessment Time Calculation: Start Time: 1534 Stop Time: 1558 Total Time in Minutes (Assessment Completion): 24   Subjective:   Glenn Barnett. is a 71 y.o. male patient admitted with hx of Anxiety, Bipolar, depression, homelessness and suicide ideation.  Patient was seen yesterday in this ER and was transferred to [facility]  for observation.  This morning he was discharged with with resources for Shelters and [facility] .  Patient ended up in the ER with Suicide ideation with plan to run into a moving car..   .  HPI:  Patient was seen this afternoon awake, alert and oriented x5.  He admitted feeling depressed and his trigger is being homeless and not having a place to sleep or eat.  Patient reports feeling hopeless and worthless.  He receives small amount of SS check but uses the money to pay for his car so he can move around and the rest for hotel accomodation.  Patient reports he cannot afford hotel accomodation anymore.  Although patient denies SI/HI/AVH today but he threatens that if he is let go without treatment he will have no choice but run into traffic and end it all.  Patient repeatedly stated that he cannot see himself sleeping in his car in this cold weather.  He remembers in the past taking Sertraline, Prozac and Lexapro.  Currently he has not been on Medications for Depression and has insurance to get his medications.  We will start patient on Sertraline for depression and Trazodone for sleep.  We will seek inpatient hospitalization  in a Geropsychiatry  unit. Patient is a 71 years old male  alert, well groomed homeless and endorsing suicide by running into a moving vehicle.  Based on his age, caucasian, feelings of hopelessness and worthlessness we will consider admitting him to [facility] psych unit for stabilization.  Past Psychiatric History:  Diagnosis of Anxiety and Bipolar, Depression, anxiety.  No outpatient Psychiatrist and have not been taking Medications.  Risk to Self or Others: Is the patient at risk to self? Yes Has the patient been a risk to self in the past 6 months? Yes Has the patient been a risk to self within the distant past? Yes Is the patient a risk to others? No Has the patient been a risk to others in the past 6 months? No Has the patient been a risk to others within the distant past? No  [place] Scale:  Flowsheet Row ED from 09/16/2022 in [facility] Most recent reading at 09/16/2022  2:43 AM ED from 09/15/2022 in [facility] Most recent reading at 09/15/2022  4:44 PM ED from 09/15/2022 in [facility] EMERGENCY DEPARTMENT Most recent reading at 09/15/2022  8:35 AM  C-SSRS RISK CATEGORY Error: Q3, 4, or 5 should not be populated when Q2 is No High Risk Error: Q3, 4, or 5 should not be populated when Q2 is No       AIMS:  , , ,  ,   ASAM:    Substance Abuse:     Past Medical History:  Past Medical History:  Diagnosis Date   Brain aneurysm  Diabetes mellitus without complication [facility])     Past Surgical History:  Procedure Laterality Date   BRAIN SURGERY     Family History: No family history on file. Family Psychiatric  History: unknown Social History:  Social History   Substance and Sexual Activity  Alcohol Use Not Currently     Social History   Substance and Sexual Activity  Drug Use Not Currently    Social History   Socioeconomic History   Marital status: Divorced    Spouse name: Not on file   Number of children: Not on file   Years of education: Not on  file   Highest education level: Not on file  Occupational History   Not on file  Tobacco Use   Smoking status: Never   Smokeless tobacco: Never  Vaping Use   Vaping Use: Never used  Substance and Sexual Activity   Alcohol use: Not Currently   Drug use: Not Currently   Sexual activity: Not on file  Other Topics Concern   Not on file  Social History Narrative   Not on file   Social Determinants of Health   Financial Resource Strain: Not on file  Food Insecurity: Not on file  Transportation Needs: Not on file  Physical Activity: Not on file  Stress: Not on file  Social Connections: Not on file   Additional Social History:    Allergies:   Allergies  Allergen Reactions   Morphine Other (See Comments)    Other reaction(s): Other (See Comments), Other (See Comments)  Recovering drug addict  Other reaction(s): Other (See Comments), Other (See Comments) Recovering drug addict Recovering drug addict   Other Other (See Comments)    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    Labs:  Results for orders placed or performed during the hospital encounter of 09/16/22 (from the past 48 hour(s))  CBC     Status: None   Collection Time: 09/16/22  1:11 PM  Result Value Ref Range   WBC 7.9 4.0 - 10.5 K/uL   RBC 5.25 4.22 - 5.81 MIL/uL   Hemoglobin 15.3 13.0 - 17.0 g/dL   HCT 27.7 82.4 - 23.5 %   MCV 92.6 80.0 - 100.0 fL   MCH 29.1 26.0 - 34.0 pg   MCHC 31.5 30.0 - 36.0 g/dL   RDW 36.1 44.3 - 15.4 %   Platelets 273 150 - 400 K/uL   nRBC 0.0 0.0 - 0.2 %    Comment: Performed at [facility], [address], [place], [place] [address]  Basic metabolic panel     Status: Abnormal   Collection Time: 09/16/22  1:11 PM  Result Value Ref Range   Sodium 140 135 - 145 mmol/L   Potassium 4.1 3.5 - 5.1 mmol/L   Chloride 107 98 - 111 mmol/L   CO2 26 22 - 32 mmol/L   Glucose, Bld 120 (H) 70 - 99 mg/dL    Comment: Glucose reference range applies  only to samples taken after fasting for at least 8 hours.   BUN 17 8 - 23 mg/dL   Creatinine, Ser 6.19 0.61 - 1.24 mg/dL   Calcium 9.0 8.9 - 50.9 mg/dL   GFR, Estimated >32 >67 mL/min    Comment: (NOTE) Calculated using the CKD-EPI Creatinine Equation (2021)    Anion gap 7 5 - 15    Comment: Performed at [facility], [address] [place], [place] [address]  Ethanol     Status: None   Collection Time: 09/16/22  1:11 PM  Result Value Ref Range   Alcohol, Ethyl (B) <10 <10 mg/dL    Comment: (NOTE) Lowest detectable limit for serum alcohol is 10 mg/dL.  For medical purposes only. Performed at [facility], [address] [place], [place] [address]   Salicylate level     Status: Abnormal   Collection Time: 09/16/22  1:11 PM  Result Value Ref Range   Salicylate Lvl <7.0 (L) 7.0 - 30.0 mg/dL    Comment: Performed at [facility], [address], [place], [place] [address]  Acetaminophen level     Status: Abnormal   Collection Time: 09/16/22  1:11 PM  Result Value Ref Range   Acetaminophen (Tylenol), Serum <10 (L) 10 - 30 ug/mL    Comment: (NOTE) Therapeutic concentrations vary significantly. A range of 10-30 ug/mL  may be an effective concentration for many patients. However, some  are best treated at concentrations outside of this range. Acetaminophen concentrations >150 ug/mL at 4 hours after ingestion  and >50 ug/mL at 12 hours after ingestion are often associated with  toxic reactions.  Performed at [facility], [address] [place], [place] [address]     Current Facility-Administered Medications  Medication Dose Route Frequency Provider Last Rate Last Admin   sertraline (ZOLOFT) tablet 25 mg  25 mg Oral Daily Martha Ellerby C, NP       traZODone (DESYREL) tablet 50 mg  50 mg Oral QHS PRN Earney Navy, NP       Current Outpatient Medications  Medication Sig Dispense Refill    gabapentin (NEURONTIN) 100 MG capsule Take 100 mg by mouth 3 (three) times daily.     metFORMIN (GLUCOPHAGE) 500 MG tablet Take 1 tablet (500 mg total) by mouth 2 (two) times daily with a meal. 60 tablet 2   albuterol (VENTOLIN HFA) 108 (90 Base) MCG/ACT inhaler Inhale 2 puffs into the lungs every 6 (six) hours as needed for wheezing or shortness of breath. (Patient not taking: Reported on 09/16/2022) 8 g 2   buPROPion (WELLBUTRIN) 100 MG tablet Take 100 mg by mouth daily.     cholecalciferol (CHOLECALCIFEROL) 25 MCG tablet Take 1 tablet (1,000 Units total) by mouth daily. (Patient not taking: Reported on 09/16/2022) 30 tablet 0   vitamin B-12 100 MCG tablet Take 1 tablet (100 mcg total) by mouth daily. (Patient not taking: Reported on 09/16/2022) 30 tablet 0    Musculoskeletal: Strength & Muscle Tone: within normal limits Gait & Station: normal Patient leans: Front   Psychiatric Specialty Exam: Presentation  General Appearance:  Casual; Neat  Eye Contact: Good  Speech: Clear and Coherent; Normal Rate  Speech Volume: Normal  Handedness: Right   Mood and Affect  Mood: Depressed; Anxious; Hopeless; Worthless  Affect: Congruent; Depressed   Thought Process  Thought Processes: Coherent; Goal Directed; Linear  Descriptions of Associations:Intact  Orientation:Full (Time, Place and Person)  Thought Content:Logical  History of Schizophrenia/Schizoaffective disorder:No data recorded Duration of Psychotic Symptoms:No data recorded Hallucinations:Hallucinations: None  Ideas of Reference:None  Suicidal Thoughts:Suicidal Thoughts: Yes, Active SI Active Intent and/or Plan: With Intent; With Plan; With Means to Carry Out; With Access to Means SI Passive Intent and/or Plan: With Plan  Homicidal Thoughts:Homicidal Thoughts: No   Sensorium  Memory: Immediate Good; Recent Good; Remote Good  Judgment: Intact  Insight: Present   Executive Functions   Concentration: Good  Attention  Span: Good  Recall: Good  Fund of Knowledge: Good  Language: Good   Psychomotor Activity  Psychomotor Activity: Psychomotor Activity: Normal   Assets  Assets: Communication Skills; Desire for Improvement    Sleep  Sleep: Sleep: Fair Number of Hours of Sleep: 6   Physical Exam: Physical Exam Vitals and nursing note reviewed.  Constitutional:      Appearance: Normal appearance.  HENT:     Head: Normocephalic and atraumatic.     Nose: Nose normal.  Cardiovascular:     Rate and Rhythm: Normal rate and regular rhythm.  Pulmonary:     Effort: Pulmonary effort is normal.  Musculoskeletal:        General: Normal range of motion.     Cervical back: Normal range of motion.  Skin:    General: Skin is warm and dry.  Neurological:     General: No focal deficit present.     Mental Status: He is alert and oriented to person, place, and time.    Review of Systems  Constitutional: Negative.   HENT: Negative.    Eyes: Negative.   Respiratory: Negative.    Cardiovascular: Negative.   Gastrointestinal: Negative.   Genitourinary: Negative.   Musculoskeletal: Negative.   Skin: Negative.   Neurological: Negative.   Endo/Heme/Allergies: Negative.   Psychiatric/Behavioral:  Positive for depression and suicidal ideas. The patient is nervous/anxious and has insomnia.    There were no vitals taken for this visit. There is no height or weight on file to calculate BMI.  Medical Decision Making: Patient is a 71 years old male alert, well groomed homeless and endorsing suicide by running into a moving vehicle.  Based on his age, caucasian, feelings of hopelessness and worthlessness we will consider [facility] for stabilization.  Meanwhile  we will start  Sertraline  for depression and anxiety and Trazodone for sleep.  Problem 1: Recurrent Major Depressive disorder, severe without Psychotic features  Problem  2: Suicide ideation.  Disposition:  Admit, seek placement at any facility that has Geropsychiatry bed.  Earney NavyJosephine C Natilie Krabbenhoft, NP-  PMHNP-BC 09/16/2022 4:00 PM

## 2022-09-16 NOTE — ED Notes (Signed)
Patient observed/assessed in bed/chair resting quietly appearing in no distress and verbalizing no complaints at this time. Will continue to monitor.  

## 2022-09-16 NOTE — ED Provider Notes (Signed)
[facility] Continuous Assessment Admission H&P  Date: 09/16/22 Patient Name: Glenn Barnett. MRN: [id number] Chief Complaint:  Chief Complaint  Patient presents with   Suicidal      Diagnoses:  Final diagnoses:  None    HPI: Glenn Barnett. is a 71 year old male with history of bipolar disorder, depression, and anxiety.  Patient presented voluntarily to [facility] reporting worsening depressive symptoms and suicidal ideation.  He was evaluated by psychiatric provider Motley[place]Mangrum, PMHNP and recommended for continuous assessment and [facility].  Patient was seen face-to-face and his chart was reviewed upon his arrival to [facility].  On evaluation, patient is alert and oriented x 4. He is noted to be angry and irritable.  He is speaking in a clear tone of voice at a moderate rate with good eye contact.  His thought process is coherent and relevant.  No signs the patient is responding to any internal/external stimuli or experiencing any delusional thought content.  Patient denies active suicidal ideation however he reports that he is unsure if he will be able to maintain safety if discharged tonight.  He denies homicidal ideation, hallucination, paranoia, and current substance abuse.  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  He says that he is depressed due to current living situation andd a would like assistance with housing resources.   PHQ 2-9:   Flowsheet Row ED from 09/15/2022 in [facility] Most recent reading at 09/15/2022  4:44 PM ED from 09/15/2022 in [facility] EMERGENCY DEPARTMENT Most recent reading at 09/15/2022  8:35 AM ED from 09/14/2022 in [facility] EMERGENCY DEPARTMENT Most recent reading at 09/14/2022  5:22 PM  C-SSRS RISK CATEGORY High Risk Error: Q3, 4, or 5 should not be populated when Q2 is No Moderate Risk        Total Time spent with patient: 20  minutes  Musculoskeletal  Strength & Muscle Tone: within normal limits Gait & Station: normal Patient leans: Right  Psychiatric Specialty Exam  Presentation General Appearance:  Casual  Eye Contact: Good  Speech: Clear and Coherent  Speech Volume: Normal  Handedness: Right   Mood and Affect  Mood: Irritable; Angry  Affect: Flat   Thought Process  Thought Processes: Coherent  Descriptions of Associations:Intact  Orientation:Full (Time, Place and Person)  Thought Content:WDL    Hallucinations:Hallucinations: None  Ideas of Reference:None  Suicidal Thoughts:Suicidal Thoughts: No SI Passive Intent and/or Plan: With Plan  Homicidal Thoughts:Homicidal Thoughts: No   Sensorium  Memory: Immediate Good; Recent Fair; Remote Fair  Judgment: Fair  Insight: Good   Executive Functions  Concentration: Fair  Attention Span: Good  Recall: Good  Fund of Knowledge: Good  Language: Fair   Psychomotor Activity  Psychomotor Activity: Psychomotor Activity: Normal   Assets  Assets: Desire for Improvement; Physical Health   Sleep  Sleep: Sleep: Fair Number of Hours of Sleep: 6   Nutritional Assessment (For OBS and FBC admissions only) Has the patient had a weight loss or gain of 10 pounds or more in the last 3 months?: No Has the patient had a decrease in food intake/or appetite?: No Does the patient have dental problems?: No Does the patient have eating habits or behaviors that may be indicators of an eating disorder including binging or inducing vomiting?: No Has the patient recently lost weight without trying?: 0 Has the patient been eating poorly because of a  decreased appetite?: 0 Malnutrition Screening Tool Score: 0    Physical Exam Vitals and nursing note reviewed.  Constitutional:      General: He is not in acute distress.    Appearance: He is well-developed.  HENT:     Head: Normocephalic and atraumatic.  Eyes:      Conjunctiva/sclera: Conjunctivae normal.  Cardiovascular:     Rate and Rhythm: Normal rate.     Heart sounds: No murmur heard. Pulmonary:     Effort: Pulmonary effort is normal. No respiratory distress.  Abdominal:     Palpations: Abdomen is soft.     Tenderness: There is no abdominal tenderness.  Musculoskeletal:        General: No swelling.     Cervical back: Normal range of motion.  Skin:    General: Skin is warm and dry.     Capillary Refill: Capillary refill takes less than 2 seconds.  Neurological:     Mental Status: He is alert and oriented to person, place, and time.  Psychiatric:        Attention and Perception: Attention and perception normal.        Mood and Affect: Mood is anxious. Affect is angry.        Speech: Speech normal.        Behavior: Behavior is agitated. Behavior is cooperative.        Thought Content: Thought content normal.        Cognition and Memory: Cognition normal.    Review of Systems  Constitutional: Negative.   HENT: Negative.    Eyes: Negative.   Respiratory: Negative.    Cardiovascular: Negative.   Gastrointestinal: Negative.   Genitourinary: Negative.   Musculoskeletal: Negative.   Skin: Negative.   Neurological: Negative.   Endo/Heme/Allergies: Negative.   Psychiatric/Behavioral:  Positive for depression.     Blood pressure (!) 170/106, pulse 65, temperature 98.9 F (37.2 C), temperature source Oral, resp. rate 20, SpO2 100 %. There is no height or weight on file to calculate BMI.  Past Psychiatric History: Bipolar disorder, depression, anxiety  Is the patient at risk to self? No  Has the patient been a risk to self in the past 6 months? Yes .    Has the patient been a risk to self within the distant past? Yes   Is the patient a risk to others? No   Has the patient been a risk to others in the past 6 months? No   Has the patient been a risk to others within the distant past? No   Past Medical History:  Past Medical History:   Diagnosis Date   Brain aneurysm    Diabetes mellitus without complication (HCC)     Past Surgical History:  Procedure Laterality Date   BRAIN SURGERY      Family History: No family history on file.  Social History:  Social History   Socioeconomic History   Marital status: Divorced    Spouse name: Not on file   Number of children: Not on file   Years of education: Not on file   Highest education level: Not on file  Occupational History   Not on file  Tobacco Use   Smoking status: Never   Smokeless tobacco: Never  Vaping Use   Vaping Use: Never used  Substance and Sexual Activity   Alcohol use: Not Currently   Drug use: Not Currently   Sexual activity: Not on file  Other Topics Concern   Not on  file  Social History Narrative   Not on file   Social Determinants of Health   Financial Resource Strain: Not on file  Food Insecurity: Not on file  Transportation Needs: Not on file  Physical Activity: Not on file  Stress: Not on file  Social Connections: Not on file  Intimate Partner Violence: Not on file    SDOH:  SDOH Screenings   Tobacco Use: Low Risk  (09/15/2022)    Last Labs:  Admission on 09/16/2022  Component Date Value Ref Range Status   POC Amphetamine UR 09/16/2022 None Detected  NONE DETECTED (Cut Off Level 1000 ng/mL) Final   POC Secobarbital (BAR) 09/16/2022 None Detected  NONE DETECTED (Cut Off Level 300 ng/mL) Final   POC Buprenorphine (BUP) 09/16/2022 None Detected  NONE DETECTED (Cut Off Level 10 ng/mL) Final   POC Oxazepam (BZO) 09/16/2022 None Detected  NONE DETECTED (Cut Off Level 300 ng/mL) Final   POC Cocaine UR 09/16/2022 None Detected  NONE DETECTED (Cut Off Level 300 ng/mL) Final   POC Methamphetamine UR 09/16/2022 None Detected  NONE DETECTED (Cut Off Level 1000 ng/mL) Final   POC Morphine 09/16/2022 None Detected  NONE DETECTED (Cut Off Level 300 ng/mL) Final   POC Methadone UR 09/16/2022 None Detected  NONE DETECTED (Cut Off Level 300  ng/mL) Final   POC Oxycodone UR 09/16/2022 None Detected  NONE DETECTED (Cut Off Level 100 ng/mL) Final   POC Marijuana UR 09/16/2022 Positive (A)  NONE DETECTED (Cut Off Level 50 ng/mL) Final  Admission on 09/15/2022, Discharged on 09/15/2022  Component Date Value Ref Range Status   Sodium 09/15/2022 138  135 - 145 mmol/L Final   Potassium 09/15/2022 3.8  3.5 - 5.1 mmol/L Final   Chloride 09/15/2022 106  98 - 111 mmol/L Final   CO2 09/15/2022 25  22 - 32 mmol/L Final   Glucose, Bld 09/15/2022 124 (H)  70 - 99 mg/dL Final   Glucose reference range applies only to samples taken after fasting for at least 8 hours.   BUN 09/15/2022 19  8 - 23 mg/dL Final   Creatinine, Ser 09/15/2022 1.24  0.61 - 1.24 mg/dL Final   Calcium 67/89/3810 8.6 (L)  8.9 - 10.3 mg/dL Final   Total Protein 17/51/0258 6.5  6.5 - 8.1 g/dL Final   Albumin 52/77/8242 3.2 (L)  3.5 - 5.0 g/dL Final   AST 35/36/1443 17  15 - 41 U/L Final   ALT 09/15/2022 14  0 - 44 U/L Final   Alkaline Phosphatase 09/15/2022 57  38 - 126 U/L Final   Total Bilirubin 09/15/2022 1.2  0.3 - 1.2 mg/dL Final   GFR, Estimated 09/15/2022 >60  >60 mL/min Final   Comment: (NOTE) Calculated using the CKD-EPI Creatinine Equation (2021)    Anion gap 09/15/2022 7  5 - 15 Final   Performed at [facility], [address], [place], [place] [address]   WBC 09/15/2022 9.1  4.0 - 10.5 K/uL Final   RBC 09/15/2022 5.06  4.22 - 5.81 MIL/uL Final   Hemoglobin 09/15/2022 14.9  13.0 - 17.0 g/dL Final   HCT 86/76/1950 47.2  39.0 - 52.0 % Final   MCV 09/15/2022 93.3  80.0 - 100.0 fL Final   MCH 09/15/2022 29.4  26.0 - 34.0 pg Final   MCHC 09/15/2022 31.6  30.0 - 36.0 g/dL Final   RDW 93/26/7124 13.7  11.5 - 15.5 % Final   Platelets 09/15/2022 269  150 - 400 K/uL  Final   nRBC 09/15/2022 0.0  0.0 - 0.2 % Final   Neutrophils Relative % 09/15/2022 65  % Final   Neutro Abs 09/15/2022 5.9  1.7 - 7.7 K/uL Final   Lymphocytes Relative 09/15/2022  22  % Final   Lymphs Abs 09/15/2022 2.0  0.7 - 4.0 K/uL Final   Monocytes Relative 09/15/2022 11  % Final   Monocytes Absolute 09/15/2022 1.0  0.1 - 1.0 K/uL Final   Eosinophils Relative 09/15/2022 2  % Final   Eosinophils Absolute 09/15/2022 0.1  0.0 - 0.5 K/uL Final   Basophils Relative 09/15/2022 0  % Final   Basophils Absolute 09/15/2022 0.0  0.0 - 0.1 K/uL Final   Immature Granulocytes 09/15/2022 0  % Final   Abs Immature Granulocytes 09/15/2022 0.02  0.00 - 0.07 K/uL Final   Performed at [facility], [address], [place], [place] [address]   SARS Coronavirus 2 by RT PCR 09/15/2022 NEGATIVE  NEGATIVE Final   Comment: (NOTE) SARS-CoV-2 target nucleic acids are NOT DETECTED.  The SARS-CoV-2 RNA is generally detectable in upper and lower respiratory specimens during the acute phase of infection. The lowest concentration of SARS-CoV-2 viral copies this assay can detect is 250 copies / mL. A negative result does not preclude SARS-CoV-2 infection and should not be used as the sole basis for treatment or other patient management decisions.  A negative result may occur with improper specimen collection / handling, submission of specimen other than nasopharyngeal swab, presence of viral mutation(s) within the areas targeted by this assay, and inadequate number of viral copies (<250 copies / mL). A negative result must be combined with clinical observations, patient history, and epidemiological information.  Fact Sheet for Patients:   RoadLapTop.co.za  Fact Sheet for Healthcare Providers: http://kim-miller.com/  This test is not yet approved or                           cleared by the [place] FDA and has been authorized for detection and/or diagnosis of SARS-CoV-2 by FDA under an Emergency Use Authorization (EUA).  This EUA will remain in effect (meaning this test can be used) for the duration of the COVID-19  declaration under Section 564(b)(1) of the Act, 21 U.S.C. section 360bbb-3(b)(1), unless the authorization is terminated or revoked sooner.  Performed at [facility], [address], [place], [place] [address]   Admission on 09/14/2022, Discharged on 09/14/2022  Component Date Value Ref Range Status   Sodium 09/14/2022 140  135 - 145 mmol/L Final   Potassium 09/14/2022 3.9  3.5 - 5.1 mmol/L Final   Chloride 09/14/2022 106  98 - 111 mmol/L Final   CO2 09/14/2022 25  22 - 32 mmol/L Final   Glucose, Bld 09/14/2022 115 (H)  70 - 99 mg/dL Final   Glucose reference range applies only to samples taken after fasting for at least 8 hours.   BUN 09/14/2022 17  8 - 23 mg/dL Final   Creatinine, Ser 09/14/2022 1.19  0.61 - 1.24 mg/dL Final   Calcium 09/81/1914 8.9  8.9 - 10.3 mg/dL Final   Total Protein 78/29/5621 6.4 (L)  6.5 - 8.1 g/dL Final   Albumin 30/86/5784 3.7  3.5 - 5.0 g/dL Final   AST 69/62/9528 19  15 - 41 U/L Final   ALT 09/14/2022 17  0 - 44 U/L Final   Alkaline Phosphatase 09/14/2022 62  38 - 126 U/L Final  Total Bilirubin 09/14/2022 1.1  0.3 - 1.2 mg/dL Final   GFR, Estimated 09/14/2022 >60  >60 mL/min Final   Comment: (NOTE) Calculated using the CKD-EPI Creatinine Equation (2021)    Anion gap 09/14/2022 9  5 - 15 Final   Performed at [facility], [address], [place], [place] [address]   Alcohol, Ethyl (B) 09/14/2022 <10  <10 mg/dL Final   Comment: (NOTE) Lowest detectable limit for serum alcohol is 10 mg/dL.  For medical purposes only. Performed at [facility], [address] [place], [place] [address]    Salicylate Lvl 09/14/2022 <7.0 (L)  7.0 - 30.0 mg/dL Final   Performed at [facility], [address] [place], [place] [address]   Acetaminophen (Tylenol), Serum 09/14/2022 <10 (L)  10 - 30 ug/mL Final   Comment: (NOTE) Therapeutic concentrations vary significantly. A range of 10-30 ug/mL  may be an effective  concentration for many patients. However, some  are best treated at concentrations outside of this range. Acetaminophen concentrations >150 ug/mL at 4 hours after ingestion  and >50 ug/mL at 12 hours after ingestion are often associated with  toxic reactions.  Performed at [facility], [address] [place], [place] [address]    WBC 09/14/2022 10.3  4.0 - 10.5 K/uL Final   RBC 09/14/2022 5.28  4.22 - 5.81 MIL/uL Final   Hemoglobin 09/14/2022 15.7  13.0 - 17.0 g/dL Final   HCT 29/56/2130 47.9  39.0 - 52.0 % Final   MCV 09/14/2022 90.7  80.0 - 100.0 fL Final   MCH 09/14/2022 29.7  26.0 - 34.0 pg Final   MCHC 09/14/2022 32.8  30.0 - 36.0 g/dL Final   RDW 86/57/8469 13.7  11.5 - 15.5 % Final   Platelets 09/14/2022 310  150 - 400 K/uL Final   nRBC 09/14/2022 0.0  0.0 - 0.2 % Final   Performed at [facility], [address], [place], [place] [address]  Admission on 06/23/2022, Discharged on 06/25/2022  Component Date Value Ref Range Status   Sodium 06/23/2022 142  135 - 145 mmol/L Final   Potassium 06/23/2022 3.4 (L)  3.5 - 5.1 mmol/L Final   Chloride 06/23/2022 107  98 - 111 mmol/L Final   CO2 06/23/2022 26  22 - 32 mmol/L Final   Glucose, Bld 06/23/2022 108 (H)  70 - 99 mg/dL Final   Glucose reference range applies only to samples taken after fasting for at least 8 hours.   BUN 06/23/2022 12  8 - 23 mg/dL Final   Creatinine, Ser 06/23/2022 1.14  0.61 - 1.24 mg/dL Final   Calcium 84/13/2440 9.3  8.9 - 10.3 mg/dL Final   Total Protein 08/05/2535 6.8  6.5 - 8.1 g/dL Final   Albumin 64/40/3474 3.6  3.5 - 5.0 g/dL Final   AST 25/95/6387 20  15 - 41 U/L Final   ALT 06/23/2022 11  0 - 44 U/L Final   Alkaline Phosphatase 06/23/2022 64  38 - 126 U/L Final   Total Bilirubin 06/23/2022 1.2  0.3 - 1.2 mg/dL Final   GFR, Estimated 06/23/2022 >60  >60 mL/min Final   Comment: (NOTE) Calculated using the CKD-EPI Creatinine Equation (2021)    Anion gap 06/23/2022 9  5 - 15 Final    Performed at [facility], [address] [place], [place] [address]   Lactic Acid, Venous 06/23/2022 2.0 (HH)  0.5 - 1.9 mmol/L Final   Comment:  CRITICAL VALUE NOTED. VALUE IS CONSISTENT WITH PREVIOUSLY REPORTED/CALLED VALUE MJU Performed at [facility], [address] [place], [place] [address]    Lactic Acid, Venous 06/23/2022 2.1 (HH)  0.5 - 1.9 mmol/L Final   Comment: CRITICAL RESULT CALLED TO, READ BACK BY AND VERIFIED WITH RN TAMESHA BRATHWAITE ON 06/23/22 @0820  RP Performed at [facility], [address] [place], [place] [address]    Troponin I (High Sensitivity) 06/23/2022 5  <18 ng/L Final   Comment: (NOTE) Elevated high sensitivity troponin I (hsTnI) values and significant  changes across serial measurements may suggest ACS but many other  chronic and acute conditions are known to elevate hsTnI results.  Refer to the "Links" section for chest pain algorithms and additional  guidance. Performed at [facility], [address] [place], [place] [address]    WBC 06/23/2022 9.4  4.0 - 10.5 K/uL Final   RBC 06/23/2022 5.78  4.22 - 5.81 MIL/uL Final   Hemoglobin 06/23/2022 16.5  13.0 - 17.0 g/dL Final   HCT 91/47/8295 50.9  39.0 - 52.0 % Final   MCV 06/23/2022 88.1  80.0 - 100.0 fL Final   MCH 06/23/2022 28.5  26.0 - 34.0 pg Final   MCHC 06/23/2022 32.4  30.0 - 36.0 g/dL Final   RDW 62/13/0865 13.4  11.5 - 15.5 % Final   Platelets 06/23/2022 346  150 - 400 K/uL Final   nRBC 06/23/2022 0.0  0.0 - 0.2 % Final   Neutrophils Relative % 06/23/2022 74  % Final   Neutro Abs 06/23/2022 7.0  1.7 - 7.7 K/uL Final   Lymphocytes Relative 06/23/2022 17  % Final   Lymphs Abs 06/23/2022 1.6  0.7 - 4.0 K/uL Final   Monocytes Relative 06/23/2022 8  % Final   Monocytes Absolute 06/23/2022 0.7  0.1 - 1.0 K/uL Final   Eosinophils Relative 06/23/2022 1  % Final   Eosinophils Absolute 06/23/2022 0.1  0.0 - 0.5 K/uL Final   Basophils Relative  06/23/2022 0  % Final   Basophils Absolute 06/23/2022 0.0  0.0 - 0.1 K/uL Final   Immature Granulocytes 06/23/2022 0  % Final   Abs Immature Granulocytes 06/23/2022 0.03  0.00 - 0.07 K/uL Final   Performed at [facility], [address] [place], [place] [address]   D-Dimer, Quant 06/23/2022 0.51 (H)  0.00 - 0.50 ug/mL-FEU Final   Comment: (NOTE) At the manufacturer cut-off value of 0.5 g/mL FEU, this assay has a negative predictive value of 95-100%.This assay is intended for use in conjunction with a clinical pretest probability (PTP) assessment model to exclude pulmonary embolism (PE) and deep venous thrombosis (DVT) in outpatients suspected of PE or DVT. Results should be correlated with clinical presentation. Performed at [facility], [address] [place], [place] [address]    Color, Urine 06/23/2022 YELLOW (A)  YELLOW Final   APPearance 06/23/2022 CLEAR (A)  CLEAR Final   Specific Gravity, Urine 06/23/2022 >1.046 (H)  1.005 - 1.030 Final   pH 06/23/2022 6.0  5.0 - 8.0 Final   Glucose, UA 06/23/2022 NEGATIVE  NEGATIVE mg/dL Final   Hgb urine dipstick 06/23/2022 NEGATIVE  NEGATIVE Final   Bilirubin Urine 06/23/2022 NEGATIVE  NEGATIVE Final   Ketones, ur 06/23/2022 NEGATIVE  NEGATIVE mg/dL Final   Protein, ur 84/13/2440 NEGATIVE  NEGATIVE mg/dL Final   Nitrite 08/05/2535 NEGATIVE  NEGATIVE Final   Leukocytes,Ua 06/23/2022 NEGATIVE  NEGATIVE Final   Performed at [facility], [address] [place], [place] [address]   B Natriuretic Peptide 06/23/2022 39.8  0.0 - 100.0 pg/mL Final   Performed at [facility], [address] [place], [place] [address]   Ferritin 06/23/2022 210  24 - 336 ng/mL Final   Performed at [facility], [address] [place], [place] [address]   Iron 06/23/2022 49  45 - 182 ug/dL Final   TIBC 24/40/1027 244 (L)  250 - 450 ug/dL Final   Saturation Ratios 06/23/2022 20  17.9 - 39.5 % Final   UIBC  06/23/2022 195  ug/dL Final   Performed at [facility], [address] [place], [place] [address]   TSH 06/23/2022 0.708  0.350 - 4.500 uIU/mL Final   Comment: Performed by a 3rd Generation assay with a functional sensitivity of <=0.01 uIU/mL. Performed at [facility], [address] [place], [place] [address]    Vitamin B-12 06/23/2022 125 (L)  180 - 914 pg/mL Final   Comment: (NOTE) This assay is not validated for testing neonatal or myeloproliferative syndrome specimens for Vitamin B12 levels. Performed at [facility], [address], [place], [place] [address]    Weight 06/24/2022 3,781.33  oz Final   BP 06/24/2022 151/84  mmHg Final   Ao pk vel 06/24/2022 0.87  m/s Final   AR max vel 06/24/2022 3.44  cm2 Final   AV Peak grad 06/24/2022 3.0  mmHg Final   S' Lateral 06/24/2022 4.00  cm Final   Area-P 1/2 06/24/2022 2.73  cm2 Final   Vit D, 25-Hydroxy 06/23/2022 17.16 (L)  30 - 100 ng/mL Final   Comment: (NOTE) Vitamin D deficiency has been defined by the Institute of Medicine  and an Endocrine Society practice guideline as a level of serum 25-OH  vitamin D less than 20 ng/mL (1,2). The Endocrine Society went on to  further define vitamin D insufficiency as a level between 21 and 29  ng/mL (2).  1. IOM (Institute of Medicine). 2010. Dietary reference intakes for  calcium and D. [place]: [employer]. 2. Holick MF, Binkley , Bischoff-Ferrari HA, et al. Evaluation,  treatment, and prevention of vitamin D deficiency: an Endocrine  Society clinical practice guideline, JCEM. 2011 Jul; 96(7): 1911-30.  Performed at [facility], [address] [place], [place] [address]    Glucose-Capillary 06/23/2022 100 (H)  70 - 99 mg/dL Final   Glucose reference range applies only to samples taken after fasting for at least 8 hours.   Hgb A1c MFr Bld 06/24/2022 6.0 (H)  4.8 - 5.6 % Final   Comment: (NOTE) Pre diabetes:           5.7%-6.4%  Diabetes:              >6.4%  Glycemic control for   <7.0% adults with diabetes    Mean Plasma Glucose 06/24/2022 125.5  mg/dL Final   Performed at [facility], [address] [place], [place] [address]   Sodium 06/24/2022 142  135 - 145 mmol/L Final   Potassium 06/24/2022 3.5  3.5 - 5.1 mmol/L Final   Chloride 06/24/2022 112 (H)  98 - 111 mmol/L Final   CO2 06/24/2022 24  22 - 32 mmol/L Final   Glucose, Bld 06/24/2022 90  70 - 99 mg/dL Final   Glucose reference range applies only to samples taken after fasting for at least 8 hours.   BUN 06/24/2022 11  8 -  23 mg/dL Final   Creatinine, Ser 06/24/2022 1.06  0.61 - 1.24 mg/dL Final   Calcium 16/07/9603 8.8 (L)  8.9 - 10.3 mg/dL Final   GFR, Estimated 06/24/2022 >60  >60 mL/min Final   Comment: (NOTE) Calculated using the CKD-EPI Creatinine Equation (2021)    Anion gap 06/24/2022 6  5 - 15 Final   Performed at [facility], [address] [place], [place] [address]   Magnesium 06/24/2022 1.8  1.7 - 2.4 mg/dL Final   Performed at [facility], [address] [place], [place] [address]   Folate 06/24/2022 9.5  >5.9 ng/mL Final   Performed at [facility], [address] [place], [place] [address]   Glucose-Capillary 06/23/2022 88  70 - 99 mg/dL Final   Glucose reference range applies only to samples taken after fasting for at least 8 hours.   Comment 1 06/23/2022 Notify RN   Final   Lactic Acid, Venous 06/24/2022 0.9  0.5 - 1.9 mmol/L Final   Performed at [facility], [address] [place], [place] [address]   Glucose-Capillary 06/24/2022 90  70 - 99 mg/dL Final   Glucose reference range applies only to samples taken after fasting for at least 8 hours.   Glucose-Capillary 06/24/2022 163 (H)  70 - 99 mg/dL Final   Glucose reference range applies only to samples taken after fasting for at least 8 hours.   Glucose-Capillary 06/24/2022 77  70 - 99 mg/dL Final   Glucose  reference range applies only to samples taken after fasting for at least 8 hours.   Sodium 06/25/2022 141  135 - 145 mmol/L Final   Potassium 06/25/2022 3.8  3.5 - 5.1 mmol/L Final   Chloride 06/25/2022 108  98 - 111 mmol/L Final   CO2 06/25/2022 27  22 - 32 mmol/L Final   Glucose, Bld 06/25/2022 96  70 - 99 mg/dL Final   Glucose reference range applies only to samples taken after fasting for at least 8 hours.   BUN 06/25/2022 11  8 - 23 mg/dL Final   Creatinine, Ser 06/25/2022 1.05  0.61 - 1.24 mg/dL Final   Calcium 86/57/8469 9.0  8.9 - 10.3 mg/dL Final   GFR, Estimated 06/25/2022 >60  >60 mL/min Final   Comment: (NOTE) Calculated using the CKD-EPI Creatinine Equation (2021)    Anion gap 06/25/2022 6  5 - 15 Final   Performed at [facility], [address] [place], [place] [address]   Magnesium 06/25/2022 2.0  1.7 - 2.4 mg/dL Final   Performed at [facility], [address] [place], [place] [address]   Glucose-Capillary 06/24/2022 117 (H)  70 - 99 mg/dL Final   Glucose reference range applies only to samples taken after fasting for at least 8 hours.   Glucose-Capillary 06/24/2022 102 (H)  70 - 99 mg/dL Final   Glucose reference range applies only to samples taken after fasting for at least 8 hours.   Glucose-Capillary 06/25/2022 78  70 - 99 mg/dL Final   Glucose reference range applies only to samples taken after fasting for at least 8 hours.   Glucose-Capillary 06/25/2022 108 (H)  70 - 99 mg/dL Final   Glucose reference range applies only to samples taken after fasting for at least 8 hours.  Admission on 06/22/2022, Discharged on 06/22/2022  Component Date Value Ref Range Status   Glucose-Capillary 06/22/2022 97  70 - 99 mg/dL Final   Glucose reference range applies only to  samples taken after fasting for at least 8 hours.  Admission on 06/21/2022, Discharged on 06/21/2022  Component Date Value Ref Range Status   Sodium 06/21/2022 140  135 - 145 mmol/L Final    Potassium 06/21/2022 3.2 (L)  3.5 - 5.1 mmol/L Final   Chloride 06/21/2022 108  98 - 111 mmol/L Final   CO2 06/21/2022 24  22 - 32 mmol/L Final   Glucose, Bld 06/21/2022 159 (H)  70 - 99 mg/dL Final   Glucose reference range applies only to samples taken after fasting for at least 8 hours.   BUN 06/21/2022 16  8 - 23 mg/dL Final   Creatinine, Ser 06/21/2022 1.17  0.61 - 1.24 mg/dL Final   Calcium 40/97/3532 8.9  8.9 - 10.3 mg/dL Final   Total Protein 99/24/2683 6.7  6.5 - 8.1 g/dL Final   Albumin 41/96/2229 3.5  3.5 - 5.0 g/dL Final   AST 79/89/2119 18  15 - 41 U/L Final   ALT 06/21/2022 13  0 - 44 U/L Final   Alkaline Phosphatase 06/21/2022 59  38 - 126 U/L Final   Total Bilirubin 06/21/2022 1.3 (H)  0.3 - 1.2 mg/dL Final   GFR, Estimated 06/21/2022 >60  >60 mL/min Final   Comment: (NOTE) Calculated using the CKD-EPI Creatinine Equation (2021)    Anion gap 06/21/2022 8  5 - 15 Final   Performed at [facility], [address] [place], [place] [address]   WBC 06/21/2022 10.6 (H)  4.0 - 10.5 K/uL Final   RBC 06/21/2022 5.61  4.22 - 5.81 MIL/uL Final   Hemoglobin 06/21/2022 16.6  13.0 - 17.0 g/dL Final   HCT 81/44/8185 50.4  39.0 - 52.0 % Final   MCV 06/21/2022 89.8  80.0 - 100.0 fL Final   MCH 06/21/2022 29.6  26.0 - 34.0 pg Final   MCHC 06/21/2022 32.9  30.0 - 36.0 g/dL Final   RDW 63/14/9702 13.4  11.5 - 15.5 % Final   Platelets 06/21/2022 328  150 - 400 K/uL Final   nRBC 06/21/2022 0.0  0.0 - 0.2 % Final   Neutrophils Relative % 06/21/2022 80  % Final   Neutro Abs 06/21/2022 8.5 (H)  1.7 - 7.7 K/uL Final   Lymphocytes Relative 06/21/2022 12  % Final   Lymphs Abs 06/21/2022 1.2  0.7 - 4.0 K/uL Final   Monocytes Relative 06/21/2022 7  % Final   Monocytes Absolute 06/21/2022 0.8  0.1 - 1.0 K/uL Final   Eosinophils Relative 06/21/2022 1  % Final   Eosinophils Absolute 06/21/2022 0.1  0.0 - 0.5 K/uL Final   Basophils Relative 06/21/2022 0  % Final   Basophils  Absolute 06/21/2022 0.0  0.0 - 0.1 K/uL Final   Immature Granulocytes 06/21/2022 0  % Final   Abs Immature Granulocytes 06/21/2022 0.03  0.00 - 0.07 K/uL Final   Performed at [facility], [address] [place], [place] [address]   Troponin I (High Sensitivity) 06/21/2022 6  <18 ng/L Final   Comment: (NOTE) Elevated high sensitivity troponin I (hsTnI) values and significant  changes across serial measurements may suggest ACS but many other  chronic and acute conditions are known to elevate hsTnI results.  Refer to the "Links" section for chest pain algorithms and additional  guidance. Performed at [facility], [address] [place], [place] [address]    Color, Urine 06/21/2022 YELLOW (A)  YELLOW Final   APPearance 06/21/2022 CLEAR (A)  CLEAR Final  Specific Gravity, Urine 06/21/2022 1.013  1.005 - 1.030 Final   pH 06/21/2022 5.0  5.0 - 8.0 Final   Glucose, UA 06/21/2022 NEGATIVE  NEGATIVE mg/dL Final   Hgb urine dipstick 06/21/2022 NEGATIVE  NEGATIVE Final   Bilirubin Urine 06/21/2022 NEGATIVE  NEGATIVE Final   Ketones, ur 06/21/2022 NEGATIVE  NEGATIVE mg/dL Final   Protein, ur 16/10/960409/13/2023 NEGATIVE  NEGATIVE mg/dL Final   Nitrite 54/09/811909/13/2023 NEGATIVE  NEGATIVE Final   Leukocytes,Ua 06/21/2022 NEGATIVE  NEGATIVE Final   Performed at [facility], [address] [place], [place] [address]   Troponin I (High Sensitivity) 06/21/2022 5  <18 ng/L Final   Comment: (NOTE) Elevated high sensitivity troponin I (hsTnI) values and significant  changes across serial measurements may suggest ACS but many other  chronic and acute conditions are known to elevate hsTnI results.  Refer to the "Links" section for chest pain algorithms and additional  guidance. Performed at [facility], [address] [place], [place] [address]   Admission on 06/19/2022, Discharged on 06/19/2022  Component Date Value Ref Range Status   Sodium 06/19/2022 138  135 - 145  mmol/L Final   Potassium 06/19/2022 3.7  3.5 - 5.1 mmol/L Final   Chloride 06/19/2022 106  98 - 111 mmol/L Final   CO2 06/19/2022 22  22 - 32 mmol/L Final   Glucose, Bld 06/19/2022 190 (H)  70 - 99 mg/dL Final   Glucose reference range applies only to samples taken after fasting for at least 8 hours.   BUN 06/19/2022 16  8 - 23 mg/dL Final   Creatinine, Ser 06/19/2022 1.26 (H)  0.61 - 1.24 mg/dL Final   Calcium 30/86/578409/08/2022 9.0  8.9 - 10.3 mg/dL Final   GFR, Estimated 06/19/2022 >60  >60 mL/min Final   Comment: (NOTE) Calculated using the CKD-EPI Creatinine Equation (2021)    Anion gap 06/19/2022 10  5 - 15 Final   Performed at [facility], [address] [place], [place] [address]   WBC 06/19/2022 10.4  4.0 - 10.5 K/uL Final   RBC 06/19/2022 6.00 (H)  4.22 - 5.81 MIL/uL Final   Hemoglobin 06/19/2022 17.1 (H)  13.0 - 17.0 g/dL Final   HCT 52/84/132409/08/2022 52.8 (H)  39.0 - 52.0 % Final   MCV 06/19/2022 88.0  80.0 - 100.0 fL Final   MCH 06/19/2022 28.5  26.0 - 34.0 pg Final   MCHC 06/19/2022 32.4  30.0 - 36.0 g/dL Final   RDW 40/10/272509/08/2022 13.7  11.5 - 15.5 % Final   Platelets 06/19/2022 321  150 - 400 K/uL Final   nRBC 06/19/2022 0.0  0.0 - 0.2 % Final   Performed at [facility], [address] [place], [place] [address]   Color, Urine 06/19/2022 YELLOW (A)  YELLOW Final   APPearance 06/19/2022 HAZY (A)  CLEAR Final   Specific Gravity, Urine 06/19/2022 1.016  1.005 - 1.030 Final   pH 06/19/2022 5.0  5.0 - 8.0 Final   Glucose, UA 06/19/2022 NEGATIVE  NEGATIVE mg/dL Final   Hgb urine dipstick 06/19/2022 NEGATIVE  NEGATIVE Final   Bilirubin Urine 06/19/2022 NEGATIVE  NEGATIVE Final   Ketones, ur 06/19/2022 NEGATIVE  NEGATIVE mg/dL Final   Protein, ur 03/47/425909/08/2022 NEGATIVE  NEGATIVE mg/dL Final   Nitrite 56/38/756409/08/2022 NEGATIVE  NEGATIVE Final   Leukocytes,Ua 06/19/2022 NEGATIVE  NEGATIVE Final   Performed at [facility], [address] [place], [place] [address]    SARS Coronavirus 2 by RT PCR  06/19/2022 NEGATIVE  NEGATIVE Final   Comment: (NOTE) SARS-CoV-2 target nucleic acids are NOT DETECTED.  The SARS-CoV-2 RNA is generally detectable in upper and lower respiratory specimens during the acute phase of infection. The lowest concentration of SARS-CoV-2 viral copies this assay can detect is 250 copies / mL. A negative result does not preclude SARS-CoV-2 infection and should not be used as the sole basis for treatment or other patient management decisions.  A negative result may occur with improper specimen collection / handling, submission of specimen other than nasopharyngeal swab, presence of viral mutation(s) within the areas targeted by this assay, and inadequate number of viral copies (<250 copies / mL). A negative result must be combined with clinical observations, patient history, and epidemiological information.  Fact Sheet for Patients:   RoadLapTop.co.za  Fact Sheet for Healthcare Providers: http://kim-miller.com/  This test is not yet approved or                           cleared by the [place] FDA and has been authorized for detection and/or diagnosis of SARS-CoV-2 by FDA under an Emergency Use Authorization (EUA).  This EUA will remain in effect (meaning this test can be used) for the duration of the COVID-19 declaration under Section 564(b)(1) of the Act, 21 U.S.C. section 360bbb-3(b)(1), unless the authorization is terminated or revoked sooner.  Performed at [facility], [address] [place], [place] [address]   Admission on 04/28/2022, Discharged on 04/28/2022  Component Date Value Ref Range Status   Sodium 04/28/2022 135  135 - 145 mmol/L Final   Potassium 04/28/2022 3.7  3.5 - 5.1 mmol/L Final   Chloride 04/28/2022 103  98 - 111 mmol/L Final   CO2 04/28/2022 24  22 - 32 mmol/L Final   Glucose, Bld 04/28/2022 135 (H)  70 - 99 mg/dL Final   Glucose reference  range applies only to samples taken after fasting for at least 8 hours.   BUN 04/28/2022 21  8 - 23 mg/dL Final   Creatinine, Ser 04/28/2022 1.69 (H)  0.61 - 1.24 mg/dL Final   Calcium 54/27/0623 9.4  8.9 - 10.3 mg/dL Final   GFR, Estimated 04/28/2022 43 (L)  >60 mL/min Final   Comment: (NOTE) Calculated using the CKD-EPI Creatinine Equation (2021)    Anion gap 04/28/2022 8  5 - 15 Final   Performed at [facility], [address] [place], [place] [address]   WBC 04/28/2022 8.9  4.0 - 10.5 K/uL Final   RBC 04/28/2022 5.83 (H)  4.22 - 5.81 MIL/uL Final   Hemoglobin 04/28/2022 16.7  13.0 - 17.0 g/dL Final   HCT 15/17/6160 51.5  39.0 - 52.0 % Final   MCV 04/28/2022 88.3  80.0 - 100.0 fL Final   MCH 04/28/2022 28.6  26.0 - 34.0 pg Final   MCHC 04/28/2022 32.4  30.0 - 36.0 g/dL Final   RDW 73/71/0626 13.9  11.5 - 15.5 % Final   Platelets 04/28/2022 341  150 - 400 K/uL Final   nRBC 04/28/2022 0.0  0.0 - 0.2 % Final   Performed at [facility], [address] [place], [place] [address]   Color, Urine 04/28/2022 YELLOW (A)  YELLOW Final   APPearance 04/28/2022 CLEAR (A)  CLEAR Final   Specific Gravity, Urine 04/28/2022 1.012  1.005 - 1.030 Final   pH 04/28/2022 5.0  5.0 - 8.0 Final   Glucose, UA 04/28/2022 NEGATIVE  NEGATIVE  mg/dL Final   Hgb urine dipstick 04/28/2022 NEGATIVE  NEGATIVE Final   Bilirubin Urine 04/28/2022 NEGATIVE  NEGATIVE Final   Ketones, ur 04/28/2022 NEGATIVE  NEGATIVE mg/dL Final   Protein, ur 16/07/9603 NEGATIVE  NEGATIVE mg/dL Final   Nitrite 54/06/8118 NEGATIVE  NEGATIVE Final   Leukocytes,Ua 04/28/2022 NEGATIVE  NEGATIVE Final   Performed at [facility], [address] [place], [place] [address]   Alcohol, Ethyl (B) 04/28/2022 <10  <10 mg/dL Final   Comment: (NOTE) Lowest detectable limit for serum alcohol is 10 mg/dL.  For medical purposes only. Performed at [facility], [address] [place], [place] [address]     Troponin I (High Sensitivity) 04/28/2022 5  <18 ng/L Final   Comment: (NOTE) Elevated high sensitivity troponin I (hsTnI) values and significant  changes across serial measurements may suggest ACS but many other  chronic and acute conditions are known to elevate hsTnI results.  Refer to the "Links" section for chest pain algorithms and additional  guidance. Performed at [facility], [address] [place] [address] [place], [place] [address]     Allergies: Morphine and Other  PTA Medications: (Not in a hospital admission)   Medical Decision Making  Patient will be admitted to [facility] for continuous assessment with follow-up by psychiatry on 09-16-2022. -Review available lab results  -Resume home medication where appropriate  -Metformin 500 mg twice a day    Recommendations  Based on my evaluation the patient does not appear to have an emergency medical condition.  Maricela Bo, NP 09/16/22  1:25 AM

## 2022-09-16 NOTE — ED Triage Notes (Signed)
Pt reports "I have been dealing with depression and suicidal thoughts for a few weeks due to being homeless and not knowing when my next meal will be or when I am going to sleep. I just want to walk into traffic" Pt denies HI.

## 2022-09-16 NOTE — ED Provider Notes (Signed)
[facility]-EMERGENCY DEPT Provider Note   CSN: [id number] Arrival date & time: 09/16/22  1154     History  Chief Complaint  Patient presents with   Psychiatric Evaluation    Glenn Barnett. is a 71 y.o. male with medical history of diabetes, brain aneurysms.  Patient presents to ED for evaluation of suicidal ideation, homelessness, depression.  Patient has been seen for the same complaint 6 times in the last 5 days.  Patient seen at behavioral health urgent care this morning and discharged at 1:25 AM.  Patient originally reported to ED stating he wished to drive his car to traffic.  Today the patient presents stating he wishes to walk into traffic.  Patient reports that he has been homeless for quite some time, living out of his car and occasionally living in motels when he has money to do so.  Patient states that these factors are driving him to become depressed, hopeless and as a result feeling like he needs to end his life.  The patient states that he has having some shoulder aches because of sleeping in his car the last couple nights however denies any medical complaints.  Patient unable to tell me what conclusion of behavioral health urgent care evaluation was today.  On chart review, it appears that the decision was made to discharge the patient with appropriate outpatient homelessness resources.  Patient denies any history of suicide attempts, visual or auditory hallucinations, homicidal ideation.  HPI     Home Medications Prior to Admission medications   Medication Sig Start Date End Date Taking? Authorizing Provider  gabapentin (NEURONTIN) 100 MG capsule Take 100 mg by mouth 3 (three) times daily. 12/20/21  Yes [provider]  metFORMIN (GLUCOPHAGE) 500 MG tablet Take 1 tablet (500 mg total) by mouth 2 (two) times daily with a meal. 06/21/22 09/19/22 Yes Bradler, Clent Jacks, MD  albuterol (VENTOLIN HFA) 108 (90 Base) MCG/ACT inhaler Inhale 2 puffs into  the lungs every 6 (six) hours as needed for wheezing or shortness of breath. Patient not taking: Reported on 09/16/2022 06/25/22   Dimple Nanas, MD  buPROPion (WELLBUTRIN) 100 MG tablet Take 100 mg by mouth daily.    [provider]  cholecalciferol (CHOLECALCIFEROL) 25 MCG tablet Take 1 tablet (1,000 Units total) by mouth daily. Patient not taking: Reported on 09/16/2022 06/25/22   Dimple Nanas, MD  vitamin B-12 100 MCG tablet Take 1 tablet (100 mcg total) by mouth daily. Patient not taking: Reported on 09/16/2022 06/25/22   Dimple Nanas, MD      Allergies    Morphine and Other    Review of Systems   Review of Systems  Psychiatric/Behavioral:  Positive for suicidal ideas. Negative for hallucinations and self-injury.   All other systems reviewed and are negative.   Physical Exam Updated Vital Signs There were no vitals taken for this visit. Physical Exam Vitals and nursing note reviewed.  Constitutional:      General: He is not in acute distress.    Appearance: Normal appearance. He is not ill-appearing, toxic-appearing or diaphoretic.  HENT:     Head: Normocephalic and atraumatic.     Nose: Nose normal. No congestion.     Mouth/Throat:     Mouth: Mucous membranes are moist.     Pharynx: Oropharynx is clear.  Eyes:     Extraocular Movements: Extraocular movements intact.     Conjunctiva/sclera: Conjunctivae normal.     Pupils: Pupils are equal, round,  and reactive to light.  Cardiovascular:     Rate and Rhythm: Normal rate and regular rhythm.  Pulmonary:     Effort: Pulmonary effort is normal.     Breath sounds: Normal breath sounds. No wheezing.  Abdominal:     General: Abdomen is flat. Bowel sounds are normal.     Palpations: Abdomen is soft.     Tenderness: There is no abdominal tenderness.  Musculoskeletal:     Cervical back: Normal range of motion and neck supple. No tenderness.  Skin:    General: Skin is warm and dry.     Capillary Refill:  Capillary refill takes less than 2 seconds.  Neurological:     Mental Status: He is alert and oriented to person, place, and time.     ED Results / Procedures / Treatments   Labs (all labs ordered are listed, but only abnormal results are displayed) Labs Reviewed  BASIC METABOLIC PANEL - Abnormal; Notable for the following components:      Result Value   Glucose, Bld 120 (*)    All other components within normal limits  SALICYLATE LEVEL - Abnormal; Notable for the following components:   Salicylate Lvl <7.0 (*)    All other components within normal limits  ACETAMINOPHEN LEVEL - Abnormal; Notable for the following components:   Acetaminophen (Tylenol), Serum <10 (*)    All other components within normal limits  CBC  ETHANOL    EKG None  Radiology No results found.  Procedures Procedures   Medications Ordered in ED Medications  sertraline (ZOLOFT) tablet 25 mg (has no administration in time range)  traZODone (DESYREL) tablet 50 mg (has no administration in time range)    ED Course/ Medical Decision Making/ A&P                           Medical Decision Making  71 year old male presents ED for evaluation.  Please see HPI for further details.  On my examination the patient is afebrile, nontachycardic.  Patient lung sounds are clear bilaterally, he is not hypoxic.  The patient abdomen is soft and compressible throughout.  Patient neurological examination shows no focal neurodeficits.  Patient denies any medical complaints, states that his shoulder is slightly ache and attributes this to sleeping in a car for the past 1 week.  Patient provided ibuprofen for this.  Patient labs ordered in triage include CBC, BMP, ethanol, salicylate level, acetaminophen level.  CBC unremarkable, shows no leukocytosis or anemia.  Patient BMP with elevated glucose to 120 however not contributory to symptoms.  Patient ethanol undetectable, acetaminophen undetectable, salicylate  undetectable.  At this time, the patient is medically cleared for TTS evaluation and determination of disposition.  Update: Patient medications been reconciled, home medications have been ordered.  The patient has had a diet ordered.  On chart review, it appears that TTS has determined this patient is in need of inpatient geropsychiatry.  Patient will be made provider default.  Final Clinical Impression(s) / ED Diagnoses Final diagnoses:  Encounter for psychological evaluation    Rx / DC Orders ED Discharge Orders     None         Al Decant, PA-C 09/16/22 1626    Bethann Berkshire, MD 09/17/22 [phone]

## 2022-09-16 NOTE — Discharge Instructions (Addendum)
Patient is instructed prior to discharge to:  Take all medications as prescribed by his/her mental healthcare provider. Report any adverse effects and or reactions from the medicines to his/her outpatient provider promptly. Keep all scheduled appointments, to ensure that you are getting refills on time and to avoid any interruption in your medication.  If you are unable to keep an appointment call to reschedule.  Be sure to follow-up with resources and follow-up appointments provided.  Patient has been instructed & cautioned: To not engage in alcohol and or illegal drug use while on prescription medicines. In the event of worsening symptoms, patient is instructed to call the crisis hotline, 911 and or go to the nearest ED for appropriate evaluation and treatment of symptoms. To follow-up with his/her primary care provider for your other medical issues, concerns and or health care needs.  Information: -National Suicide Prevention Lifeline 1-800-SUICIDE or 339-350-7813.  -988 offers 24/7 access to trained crisis counselors who can help people experiencing mental health-related distress. People can call or text 988 or chat 988lifeline.org for themselves or if they are worried about a loved one who may need crisis support.       Outpatient Therapy for Medicare Recipients:  [facility] [address] [place], [place], [address] [phone] phone  [facility] [address] [place], [place], [address] [address] phone ([address] [employer] [address] - [place], [address], [place], [place], [employer], [address], [employer] Healthy [place], [place], [address], [place], [place], [place], [employer], T[employer], [employer], [employer], [employer] [place])  [facility] [address] [place], [place], [address] [phone] phone  [facility] [address] [place], [place], [address] [phone]  phone  [facility] [address] [place], [place], [address] [phone] phone [phone] fax  [facility] [address], [place], [address] [phone] phone  [facility] [address] [place], [place], [address] [phone] phone [phone] fax [facility] [address] [place], [place], [address] [phone] phone  Jovita Kussmaul [address] [place], [place], [address] [phone] phone  [facility] [address] [place], [place], [address] [phone] phone [phone] fax  [facility] [address], [place], [place] [address] [phone] Population served: Adult men & women (55 years old and older, able to perform activities for daily living) Documents required: Valid ID & Social Security Card [facility] [address], [place], [place] [address] [phone] Population served: Males 18+ Documents required: Valid ID & Social Security Card [facility] [address] [place], [place], [address] [phone] phone, ext. [phone] [place], [place], [address]  [facility] [place]) [address] [place], [place] Phone: [phone]     [facility] [address] [place], [place] [address] Phone: [phone]     [facility]  [address], [place] [address]  Phone: [phone]     [facility]: [id number]. [address] [place], [place] [address]  Phone: [phone]     [facility] [address] [place], [place] [address] (Check in at 6:00PM for placement at a local shelter) Phone: [phone]

## 2022-09-16 NOTE — ED Notes (Signed)
AVS provided to patient in order to make phone calls to shelters prior to d/c from facility.

## 2022-09-16 NOTE — ED Notes (Signed)
Pt has one belongings bag moved to [place] D cabinets.

## 2022-09-16 NOTE — ED Notes (Signed)
Pt laying down on pull out bed/chair. Encouraged Pt to continue to use the phone to find shelter services. Reached out to other staff members to find resources also.

## 2022-09-16 NOTE — ED Provider Notes (Signed)
FBC/OBS ASAP Discharge Summary  Date and Time: 09/16/2022 9:18 AM  Name: Glenn Barnett.  MRN:  [id number]   Discharge Diagnoses:  Final diagnoses:  MDD (major depressive disorder), recurrent episode, moderate ([place])    Subjective: Patient states "I need somewhere to live, and I do not have any money right now, I get my check on the third Wednesday of the month."  Patient is reassessed, face-to-face, by nurse practitioner.  He is seated in observation area upon my approach, no apparent distress.  He is alert and oriented, pleasant and cooperative during assessment.  He presents with euthymic mood, congruent affect.  Recent stressors include housing instability.  Patient's daughter and 5 grandchildren live in [place], only a 2 bedroom house and no space for patient to reside in the home.  Miloh is currently homeless in [place].  He would like assistance with finding housing.  He is open to shelters. Recently sleeping in his car.  Patient denies suicidal and homicidal ideation.  He denies history of suicide attempts, denies history of nonsuicidal self-harm behavior.  He denies auditory and visual hallucinations.  There is no evidence of delusional thought content and no indication that patient is responding to internal stimuli.  Per patient report he has been diagnosed with depression.  He is not linked with outpatient psychiatry currently but plans to follow-up with individual counseling moving forward.  He denies history of inpatient psychiatric hospitalization.  No family mental health history reported.  Most recently residing in hotels, he then ran out of money.  He denies access to weapons.  He is retired.  He denies alcohol and substance use, and recovery for 30 years.  Current plan includes resuming AA meetings.  Patient would like to be a sponsor and AA, has done so in the past.  He endorses average sleep and appetite.  Patient offered support and encouragement.   He declines any person to contact for collateral information at this time.  Stay Summary completed 09/16/2022 0125am: Glenn Barnett. is a 71 year old male with history of bipolar disorder, depression, and anxiety.  Patient presented voluntarily to [facility] reporting worsening depressive symptoms and suicidal ideation.  He was evaluated by psychiatric provider [place], PMHNP and recommended for continuous assessment and [facility].   Patient was seen face-to-face and his chart was reviewed upon his arrival to [facility].  On evaluation, patient is alert and oriented x 4. He is noted to be angry and irritable.  He is speaking in a clear tone of voice at a moderate rate with good eye contact.  His thought process is coherent and relevant.  No signs the patient is responding to any internal/external stimuli or experiencing any delusional thought content.   Patient denies active suicidal ideation however he reports that he is unsure if he will be able to maintain safety if discharged tonight.  He denies homicidal ideation, hallucination, paranoia, and current substance abuse.  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   He says that he is depressed due to current living situation and a would like assistance with housing resources.     Total Time spent with patient: 30 minutes  Past Psychiatric History: see above Past Medical History:  Past Medical History:  Diagnosis Date   Brain aneurysm    Diabetes mellitus without complication ([place])     Past Surgical History:  Procedure Laterality Date   BRAIN SURGERY  Family History: No family history on file. Family Psychiatric History: none reported Social History:  Social History   Substance and Sexual Activity  Alcohol Use Not Currently     Social History   Substance and Sexual Activity  Drug Use Not Currently    Social History   Socioeconomic History   Marital status: Divorced    Spouse  name: Not on file   Number of children: Not on file   Years of education: Not on file   Highest education level: Not on file  Occupational History   Not on file  Tobacco Use   Smoking status: Never   Smokeless tobacco: Never  Vaping Use   Vaping Use: Never used  Substance and Sexual Activity   Alcohol use: Not Currently   Drug use: Not Currently   Sexual activity: Not on file  Other Topics Concern   Not on file  Social History Narrative   Not on file   Social Determinants of Health   Financial Resource Strain: Not on file  Food Insecurity: Not on file  Transportation Needs: Not on file  Physical Activity: Not on file  Stress: Not on file  Social Connections: Not on file   SDOH:  SDOH Screenings   Tobacco Use: Low Risk  (09/15/2022)    Tobacco Cessation:  N/A, patient does not currently use tobacco products  Current Medications:  Current Facility-Administered Medications  Medication Dose Route Frequency Provider Last Rate Last Admin   acetaminophen (TYLENOL) tablet 650 mg  650 mg Oral Q6H PRN Ajibola, Ene A, NP       alum & mag hydroxide-simeth (MAALOX/MYLANTA) 200-200-20 MG/5ML suspension 30 mL  30 mL Oral Q4H PRN Ajibola, Ene A, NP       hydrOXYzine (ATARAX) tablet 25 mg  25 mg Oral TID PRN Ajibola, Ene A, NP       magnesium hydroxide (MILK OF MAGNESIA) suspension 30 mL  30 mL Oral Daily PRN Ajibola, Ene A, NP       metFORMIN (GLUCOPHAGE) tablet 500 mg  500 mg Oral BID WC Ajibola, Ene A, NP   500 mg at 09/16/22 0806   traZODone (DESYREL) tablet 50 mg  50 mg Oral QHS PRN Ajibola, Ene A, NP   50 mg at 09/16/22 0102   Current Outpatient Medications  Medication Sig Dispense Refill   albuterol (VENTOLIN HFA) 108 (90 Base) MCG/ACT inhaler Inhale 2 puffs into the lungs every 6 (six) hours as needed for wheezing or shortness of breath. 8 g 2   cholecalciferol (CHOLECALCIFEROL) 25 MCG tablet Take 1 tablet (1,000 Units total) by mouth daily. 30 tablet 0   metFORMIN  (GLUCOPHAGE) 500 MG tablet Take 1 tablet (500 mg total) by mouth 2 (two) times daily with a meal. 60 tablet 2   vitamin B-12 100 MCG tablet Take 1 tablet (100 mcg total) by mouth daily. 30 tablet 0    PTA Medications: (Not in a hospital admission)       No data to display          Flowsheet Row ED from 09/16/2022 in [facility] Most recent reading at 09/16/2022  2:43 AM ED from 09/15/2022 in [facility] Most recent reading at 09/15/2022  4:44 PM ED from 09/15/2022 in [facility] EMERGENCY DEPART[facility] Most recent reading at 09/15/2022  8:35 AM  C-SSRS RISK CATEGORY Error: Q3, 4, or 5 should not be populated when Q2 is No High Risk Error:  Q3, 4, or 5 should not be populated when Q2 is No       Musculoskeletal  Strength & Muscle Tone: within normal limits Gait & Station: normal Patient leans: N/A  Psychiatric Specialty Exam  Presentation  General Appearance:  Appropriate for Environment; Casual  Eye Contact: Good  Speech: Clear and Coherent; Normal Rate  Speech Volume: Normal  Handedness: Right   Mood and Affect  Mood: Euthymic  Affect: Appropriate; Congruent   Thought Process  Thought Processes: Coherent; Goal Directed; Linear  Descriptions of Associations:Intact  Orientation:Full (Time, Place and Person)  Thought Content:Logical; WDL     Hallucinations:Hallucinations: None  Ideas of Reference:None  Suicidal Thoughts:Suicidal Thoughts: No SI Passive Intent and/or Plan: With Plan  Homicidal Thoughts:Homicidal Thoughts: No   Sensorium  Memory: Immediate Good; Recent Good  Judgment: Fair  Insight: Fair   Executive Functions  Concentration: Good  Attention Span: Good  Recall: Good  Fund of Knowledge: Good  Language: Good   Psychomotor Activity  Psychomotor Activity: Psychomotor Activity: Normal   Assets  Assets: Communication Skills; Desire  for Improvement; Resilience; Social Support; Talents/Skills; Transportation   Sleep  Sleep: Sleep: Good Number of Hours of Sleep: 6   Nutritional Assessment (For OBS and FBC admissions only) Has the patient had a weight loss or gain of 10 pounds or more in the last 3 months?: No Has the patient had a decrease in food intake/or appetite?: No Does the patient have dental problems?: No Does the patient have eating habits or behaviors that may be indicators of an eating disorder including binging or inducing vomiting?: No Has the patient recently lost weight without trying?: 0 Has the patient been eating poorly because of a decreased appetite?: 0 Malnutrition Screening Tool Score: 0    Physical Exam  Physical Exam Vitals and nursing note reviewed.  Constitutional:      Appearance: Normal appearance. He is well-developed.  HENT:     Head: Normocephalic and atraumatic.     Nose: Nose normal.  Cardiovascular:     Rate and Rhythm: Normal rate.  Pulmonary:     Effort: Pulmonary effort is normal.  Musculoskeletal:        General: Normal range of motion.  Skin:    General: Skin is warm and dry.  Neurological:     Mental Status: He is alert and oriented to person, place, and time.  Psychiatric:        Attention and Perception: Attention and perception normal.        Mood and Affect: Mood and affect normal.        Speech: Speech normal.        Behavior: Behavior normal. Behavior is cooperative.        Thought Content: Thought content normal.        Cognition and Memory: Cognition and memory normal.    Review of Systems  Constitutional: Negative.   HENT: Negative.    Eyes: Negative.   Respiratory: Negative.    Cardiovascular: Negative.   Gastrointestinal: Negative.   Genitourinary: Negative.   Musculoskeletal: Negative.   Skin: Negative.   Neurological: Negative.   Psychiatric/Behavioral: Negative.     Blood pressure (!) 156/95, pulse 79, temperature 98.1 F (36.7 C),  temperature source Oral, resp. rate 18, SpO2 98 %. There is no height or weight on file to calculate BMI.  Demographic Factors:  Male  Loss Factors: Financial problems/change in socioeconomic status  Historical Factors: NA  Risk Reduction Factors:   Sense  of responsibility to family, Positive social support, Positive therapeutic relationship, and Positive coping skills or problem solving skills  Continued Clinical Symptoms:  Previous Psychiatric Diagnoses and Treatments  Cognitive Features That Contribute To Risk:  None    Suicide Risk:  Minimal: No identifiable suicidal ideation.  Patients presenting with no risk factors but with morbid ruminations; may be classified as minimal risk based on the severity of the depressive symptoms  Plan Of Care/Follow-up recommendations:  Labs reviewed CMP- glucose 124, calcium 8.6, albumin 3.2 UDS - + marijuana Ethanol less than 10 Respiratory Panel-influenza A and B negative, COVID negative  Patient reviewed with Dr Darleene Cleaver. Follow up with primary care provider.  Follow up with outpatient psychiatry, resources provided. Follow up with homeless resources provided.  Disposition: Discharge  Lucky Rathke, FNP 09/16/2022, 9:18 AM

## 2022-09-16 NOTE — ED Notes (Signed)
Pt's belongings have been placed behind triage. Pt changed out into purple scrubs and wanded down by security.

## 2022-09-16 NOTE — ED Notes (Signed)
Discharge instructions provided and Pt stated understanding. Pt alert, orient and ambulatory prior to d/c from facility. Personal belongings returned from locker number 21. Safe transport called for transportation services. Pt escorted to the sally port. Resources provided to patient for shelters nearby and in surrounding the cities. Pt stated he had dementia prior to leaving the observation unit and could not understand what he was being told. Information was told to three times prior to getting into the safe transport car. Also told patient he could use the phone in the ER at [address] where his car is located. Safety maintained.

## 2022-09-16 NOTE — ED Notes (Signed)
Pt A&O x 4, presents with SI thoughts of driving into traffic.  Pt reports becoming increasingly depressed while living in his car.  Monitoring for safety.

## 2022-09-17 MED ORDER — POLYETHYLENE GLYCOL 3350 17 G PO PACK
17.0000 g | PACK | Freq: Every day | ORAL | Status: DC
  Administered 2022-09-18 – 2022-09-19 (×2): 17 g via ORAL
  Filled 2022-09-17 (×2): qty 1

## 2022-09-17 MED ORDER — ACETAMINOPHEN 500 MG PO TABS
500.0000 mg | ORAL_TABLET | Freq: Four times a day (QID) | ORAL | Status: DC | PRN
  Administered 2022-09-17: 500 mg via ORAL
  Filled 2022-09-17: qty 1

## 2022-09-17 NOTE — ED Notes (Signed)
Patient given meal tray.

## 2022-09-17 NOTE — Progress Notes (Addendum)
[facility] Psych ED Progress Note  09/17/2022 12:41 PM [name].  MRN:  [id number]   Subjective:  Glenn Casias. is a 71 y.o. male patient admitted with hx of Anxiety, Bipolar, depression, homelessness and suicide ideation.  Patient was seen yesterday in this ER and was transferred to [facility]  for observation.  This morning he was discharged with with resources for Shelters and [facility] .  Patient ended up in the ER with Suicide ideation with plan to run into a moving car..     Patient was seen this morning calm but tearful.  He remains depressed and suicidal.  He feels hopeless and helpless and being homeless makes is depressed mood worse.  Patient is back on his medications while we seek bed placement at a Geropsychiatry unit.  We will continue to seek bed placement.  Patient denies HI/AVH and no mention of paranoia.  Principal Problem: Major depressive disorder, recurrent severe without psychotic features ([place]) Diagnosis:  Principal Problem:   Major depressive disorder, recurrent severe without psychotic features ([place]) Active Problems:   Suicide ideation   ED Assessment Time Calculation: Start Time: [id number] Stop Time: 1239 Total Time in Minutes (Assessment Completion): 14   Past Psychiatric History: see initial Psychiatry evaluation  [place] Scale:  [place] ED from 09/16/2022 in [facility] Most recent reading at 09/16/2022  2:43 AM ED from 09/15/2022 in [place] DEPT Most recent reading at 09/15/2022  4:44 PM ED from 09/15/2022 in [place] Most recent reading at 09/15/2022  8:35 AM  C-SSRS RISK CATEGORY Error: Q3, 4, or 5 should not be populated when Q2 is No High Risk Error: Q3, 4, or 5 should not be populated when Q2 is No       Past Medical History:  Past Medical History:  Diagnosis Date   Brain aneurysm    Diabetes mellitus without complication ([place])     Past Surgical  History:  Procedure Laterality Date   BRAIN SURGERY     Family History: No family history on file. Family Psychiatric  History: see initial Psychiatry evaluation Social History:  Social History   Substance and Sexual Activity  Alcohol Use Not Currently     Social History   Substance and Sexual Activity  Drug Use Not Currently    Social History   Socioeconomic History   Marital status: Divorced    Spouse name: Not on file   Number of children: Not on file   Years of education: Not on file   Highest education level: Not on file  Occupational History   Not on file  Tobacco Use   Smoking status: Never   Smokeless tobacco: Never  Vaping Use   Vaping Use: Never used  Substance and Sexual Activity   Alcohol use: Not Currently   Drug use: Not Currently   Sexual activity: Not on file  Other Topics Concern   Not on file  Social History Narrative   Not on file   Social Determinants of Health   Financial Resource Strain: Not on file  Food Insecurity: Not on file  Transportation Needs: Not on file  Physical Activity: Not on file  Stress: Not on file  Social Connections: Not on file    Sleep: Fair  Appetite:  Fair  Current Medications: Current Facility-Administered Medications  Medication Dose Route Frequency Provider Last Rate Last Admin   gabapentin (NEURONTIN) capsule 100 mg  100 mg Oral TID Genevive Bi  F, PA-C   100 mg at 09/17/22 [id number]   metFORMIN (GLUCOPHAGE) tablet 500 mg  500 mg Oral BID WC Azucena Cecil, PA-C   500 mg at 09/17/22 [id number]   sertraline (ZOLOFT) tablet 25 mg  25 mg Oral Daily Charmaine Downs C, NP   25 mg at 09/17/22 [id number]   traZODone (DESYREL) tablet 50 mg  50 mg Oral QHS PRN Charmaine Downs C, NP   50 mg at 09/16/22 2303   Current Outpatient Medications  Medication Sig Dispense Refill   gabapentin (NEURONTIN) 100 MG capsule Take 100 mg by mouth 3 (three) times daily.     metFORMIN (GLUCOPHAGE) 500 MG tablet Take 1 tablet (500  mg total) by mouth 2 (two) times daily with a meal. 60 tablet 2   albuterol (VENTOLIN HFA) 108 (90 Base) MCG/ACT inhaler Inhale 2 puffs into the lungs every 6 (six) hours as needed for wheezing or shortness of breath. (Patient not taking: Reported on 09/16/2022) 8 g 2   buPROPion (WELLBUTRIN) 100 MG tablet Take 100 mg by mouth daily.     cholecalciferol (CHOLECALCIFEROL) 25 MCG tablet Take 1 tablet (1,000 Units total) by mouth daily. (Patient not taking: Reported on 09/16/2022) 30 tablet 0   vitamin B-12 100 MCG tablet Take 1 tablet (100 mcg total) by mouth daily. (Patient not taking: Reported on 09/16/2022) 30 tablet 0    Lab Results:  Results for orders placed or performed during the hospital encounter of 09/16/22 (from the past 48 hour(s))  CBC     Status: None   Collection Time: 09/16/22  1:11 PM  Result Value Ref Range   WBC 7.9 4.0 - 10.5 K/uL   RBC 5.25 4.22 - 5.81 MIL/uL   Hemoglobin 15.3 13.0 - 17.0 g/dL   HCT 48.6 39.0 - 52.0 %   MCV 92.6 80.0 - 100.0 fL   MCH 29.1 26.0 - 34.0 pg   MCHC 31.5 30.0 - 36.0 g/dL   RDW 13.4 11.5 - 15.5 %   Platelets 273 150 - 400 K/uL   nRBC 0.0 0.0 - 0.2 %    Comment: Performed at [facility], [place] [address], [place], [place] [id number]  Basic metabolic panel     Status: Abnormal   Collection Time: 09/16/22  1:11 PM  Result Value Ref Range   Sodium 140 135 - 145 mmol/L   Potassium 4.1 3.5 - 5.1 mmol/L   Chloride 107 98 - 111 mmol/L   CO2 26 22 - 32 mmol/L   Glucose, Bld 120 (H) 70 - 99 mg/dL    Comment: Glucose reference range applies only to samples taken after fasting for at least 8 hours.   BUN 17 8 - 23 mg/dL   Creatinine, Ser 1.22 0.61 - 1.24 mg/dL   Calcium 9.0 8.9 - 10.3 mg/dL   GFR, Estimated >60 >60 mL/min    Comment: (NOTE) Calculated using the CKD-EPI Creatinine Equation (2021)    Anion gap 7 5 - 15    Comment: Performed at [facility], [place] [address] [place], [place] [address]  Ethanol      Status: None   Collection Time: 09/16/22  1:11 PM  Result Value Ref Range   Alcohol, Ethyl (B) <10 <10 mg/dL    Comment: (NOTE) Lowest detectable limit for serum alcohol is 10 mg/dL.  For medical purposes only. Performed at [facility], [place] [address] [place], [place] [id number]   Salicylate level  Status: Abnormal   Collection Time: 09/16/22  1:11 PM  Result Value Ref Range   Salicylate Lvl Q000111Q (L) 7.0 - 30.0 mg/dL    Comment: Performed at [facility], [place] [address], [place], [place] [address]  Acetaminophen level     Status: Abnormal   Collection Time: 09/16/22  1:11 PM  Result Value Ref Range   Acetaminophen (Tylenol), Serum <10 (L) 10 - 30 ug/mL    Comment: (NOTE) Therapeutic concentrations vary significantly. A range of 10-30 ug/mL  may be an effective concentration for many patients. However, some  are best treated at concentrations outside of this range. Acetaminophen concentrations >150 ug/mL at 4 hours after ingestion  and >50 ug/mL at 12 hours after ingestion are often associated with  toxic reactions.  Performed at [facility], [place] [address] [place], [place] [address]     Blood Alcohol level:  Lab Results  Component Value Date   ETH <10 09/16/2022   ETH <10 09/14/2022    Physical Findings:  CIWA:    COWS:     Musculoskeletal: Strength & Muscle Tone: within normal limits Gait & Station: normal Patient leans: Front  Psychiatric Specialty Exam:  Presentation  General Appearance:  Casual; Neat  Eye Contact: Good  Speech: Clear and Coherent; Normal Rate  Speech Volume: Normal  Handedness: Right   Mood and Affect  Mood: Depressed; Anxious; Hopeless; Worthless  Affect: Congruent; Depressed   Thought Process  Thought Processes: Coherent; Goal Directed; Linear  Descriptions of Associations:Intact  Orientation:Full (Time, Place and Person)  Thought  Content:Logical  History of Schizophrenia/Schizoaffective disorder:No data recorded Duration of Psychotic Symptoms:No data recorded Hallucinations:Hallucinations: None  Ideas of Reference:None  Suicidal Thoughts:Suicidal Thoughts: Yes, Active SI Active Intent and/or Plan: With Intent; With Plan; With Means to Carry Out; With Access to Means  Homicidal Thoughts:Homicidal Thoughts: No   Sensorium  Memory: Immediate Good; Recent Good; Remote Good  Judgment: Intact  Insight: Present   Executive Functions  Concentration: Good  Attention Span: Good  Recall: Good  Fund of Knowledge: Good  Language: Good   Psychomotor Activity  Psychomotor Activity: Psychomotor Activity: Normal   Assets  Assets: Communication Skills; Desire for Improvement   Sleep  Sleep: Sleep: Fair Number of Hours of Sleep: 6    Physical Exam: Physical Exam Vitals and nursing note reviewed.  Constitutional:      Appearance: Normal appearance.  HENT:     Head: Normocephalic and atraumatic.     Nose: Nose normal.  Cardiovascular:     Rate and Rhythm: Normal rate and regular rhythm.  Pulmonary:     Effort: Pulmonary effort is normal.  Musculoskeletal:        General: Normal range of motion.     Cervical back: Normal range of motion.  Skin:    General: Skin is warm and dry.  Neurological:     Mental Status: He is alert and oriented to person, place, and time.    Review of Systems  Constitutional: Negative.   HENT: Negative.    Eyes: Negative.   Respiratory: Negative.    Cardiovascular: Negative.   Gastrointestinal: Negative.   Genitourinary: Negative.   Musculoskeletal: Negative.   Skin: Negative.   Neurological: Negative.   Endo/Heme/Allergies: Negative.   Psychiatric/Behavioral:  Positive for depression and suicidal ideas. The patient is nervous/anxious and has insomnia.    Blood pressure (!) 145/77, pulse 87, temperature 97.9 F (36.6 C), temperature source Oral,  resp. rate 16, SpO2  99 %. There is no height or weight on file to calculate BMI.   Medical Decision Making: Patient continues to require inpatient Mental healthcare.  We will seek bed placement at any Geropsychiatry unit.  Meanwhile we will continue to offer Sertraline and Trazodone.  We will fax out  records to facilities that has available beds. Disposition- Admit, Seek bed placement in a Geropsychiatry unit.Earney Navy, NP-PMHNP-BC 09/17/2022, 12:41 PM

## 2022-09-17 NOTE — Progress Notes (Signed)
Per Dahlia Byes, NP, patient meets criteria for inpatient treatment. There are no available beds at [facility] today. CSW faxed referrals to the following facilities for review:  [facility]  Pending - Request Sent N/A [address], [place] [address] [phone] --  [facility]  Pending - Request Sent N/A [address] [place] [address] [phone] --  [facility]  Pending - Request Sent N/A [address], [place] [address] [phone] --  [facility]  Pending - Request Sent N/A [address] [place], [place] [address] [phone] --  [facility]  Pending - Request Sent N/A [address] [place] [address] [phone] --  [facility]  Pending - Request Sent N/A [address], [place] [address] [phone] --  [facility]  Pending - Request Sent N/A [address] [place] [address] [phone] --  [facility]  Pending - Request Sent N/A [address] [place] [address] [phone] --  [facility]  Pending - Request Sent N/A [address] [place] [address] [phone] --  [facility]  Pending - Request Sent N/A [address] [place] [address] [phone] --   TTS will continue to seek bed placement.  Crissie Reese, MSW, Lenice Pressman Phone: [phone] Disposition/TOC

## 2022-09-17 NOTE — ED Provider Notes (Signed)
Emergency Medicine Observation Re-evaluation Note  Glenn Barnett. is a 71 y.o. male, seen on rounds today.  Pt initially presented to the ED for complaints of Psychiatric Evaluation Currently, the patient is resting in NAD.  Physical Exam  BP (!) 145/77 (BP Location: Left Arm)   Pulse 87   Temp 97.9 F (36.6 C) (Oral)   Resp 16   SpO2 99%  Physical Exam General: Appears to be resting comfortably in bed, no acute distress. Cardiac: Regular rate, normal heart rate, non-emergent blood pressure for this morning's vitals. Lungs: No increased work of breathing.  Equal chest rise appreciated Psych: Calm, asleep in bed.   ED Course / MDM  EKG:   I have reviewed the labs performed to date as well as medications administered while in observation.  Plan  Current plan is admission, seek placement at any facility that has Geropsychiatry bed.    Glyn Ade, MD 09/17/22 [phone]

## 2022-09-18 DIAGNOSIS — F332 Major depressive disorder, recurrent severe without psychotic features: Secondary | ICD-10-CM

## 2022-09-18 MED ORDER — SERTRALINE HCL 50 MG PO TABS
50.0000 mg | ORAL_TABLET | Freq: Every day | ORAL | Status: DC
  Administered 2022-09-19: 50 mg via ORAL
  Filled 2022-09-18: qty 1

## 2022-09-18 NOTE — Progress Notes (Signed)
[facility] Psych ED Progress Note  09/18/2022 2:58 PM Glenn Barnett.  MRN:  [id number]   Subjective:  Glenn Gordin. is a 71 y.o. male patient admitted with hx of Anxiety, Bipolar, depression, homelessness and suicide ideation.  Patient was seen yesterday in this ER and was transferred to [facility]  for observation.  This morning he was discharged with with resources for Shelters and [facility] .  Patient ended up in the ER with Suicide ideation with plan to run into a moving car..     Patient was seen this morning calm but remains suicidal with plan to use any means to harm himself.  He rates Depression 8/10 with 10 being severe depression.  He has been on 25 MG Sertraline since arrival for depression and anxiety.  Same is increased to 50 mg po daily.  We will continue to seek inpatient mental health care at any geropsychiatry unit with available bed.  He denies HI/AVH and no mention of paranoia. Principal Problem: Major depressive disorder, recurrent severe without psychotic features ([place]) Diagnosis:  Principal Problem:   Major depressive disorder, recurrent severe without psychotic features ([place]) Active Problems:   Suicide ideation   ED Assessment Time Calculation: Start Time: [id number] Stop Time: [id number] Total Time in Minutes (Assessment Completion): 11   Past Psychiatric History: see initial Psychiatry evaluation  [place] Scale:  [place] ED from 09/16/2022 in [facility] Most recent reading at 09/16/2022  2:43 AM ED from 09/15/2022 in [place] DE[facility] Most recent reading at 09/15/2022  4:44 PM ED from 09/15/2022 in [place] Most recent reading at 09/15/2022  8:35 AM  C-SSRS RISK CATEGORY Error: Q3, 4, or 5 should not be populated when Q2 is No High Risk Error: Q3, 4, or 5 should not be populated when Q2 is No       Past Medical History:  Past Medical History:  Diagnosis Date   Brain aneurysm     Diabetes mellitus without complication ([place])     Past Surgical History:  Procedure Laterality Date   BRAIN SURGERY     Family History: No family history on file. Family Psychiatric  History: see initial Psychiatry evaluation Social History:  Social History   Substance and Sexual Activity  Alcohol Use Not Currently     Social History   Substance and Sexual Activity  Drug Use Not Currently    Social History   Socioeconomic History   Marital status: Divorced    Spouse name: Not on file   Number of children: Not on file   Years of education: Not on file   Highest education level: Not on file  Occupational History   Not on file  Tobacco Use   Smoking status: Never   Smokeless tobacco: Never  Vaping Use   Vaping Use: Never used  Substance and Sexual Activity   Alcohol use: Not Currently   Drug use: Not Currently   Sexual activity: Not on file  Other Topics Concern   Not on file  Social History Narrative   Not on file   Social Determinants of Health   Financial Resource Strain: Not on file  Food Insecurity: Not on file  Transportation Needs: Not on file  Physical Activity: Not on file  Stress: Not on file  Social Connections: Not on file    Sleep: Fair  Appetite:  Fair  Current Medications: Current Facility-Administered Medications  Medication Dose Route Frequency Provider Last Rate Last  Admin   acetaminophen (TYLENOL) tablet 500 mg  500 mg Oral Q6H PRN Davonna Belling, MD   500 mg at 09/17/22 1818   gabapentin (NEURONTIN) capsule 100 mg  100 mg Oral TID Genevive Bi F, PA-C   100 mg at 09/18/22 1227   metFORMIN (GLUCOPHAGE) tablet 500 mg  500 mg Oral BID WC Genevive Bi F, PA-C   500 mg at 09/18/22 1232   polyethylene glycol (MIRALAX / GLYCOLAX) packet 17 g  17 g Oral Daily Countryman, Cheri Rous, MD   17 g at 09/18/22 1232   [START ON 09/19/2022] sertraline (ZOLOFT) tablet 50 mg  50 mg Oral Daily Charmaine Downs C, NP       traZODone (DESYREL)  tablet 50 mg  50 mg Oral QHS PRN Charmaine Downs C, NP   50 mg at 09/16/22 2303   Current Outpatient Medications  Medication Sig Dispense Refill   gabapentin (NEURONTIN) 100 MG capsule Take 100 mg by mouth 3 (three) times daily.     metFORMIN (GLUCOPHAGE) 500 MG tablet Take 1 tablet (500 mg total) by mouth 2 (two) times daily with a meal. 60 tablet 2   albuterol (VENTOLIN HFA) 108 (90 Base) MCG/ACT inhaler Inhale 2 puffs into the lungs every 6 (six) hours as needed for wheezing or shortness of breath. (Patient not taking: Reported on 09/16/2022) 8 g 2   buPROPion (WELLBUTRIN) 100 MG tablet Take 100 mg by mouth daily.     cholecalciferol (CHOLECALCIFEROL) 25 MCG tablet Take 1 tablet (1,000 Units total) by mouth daily. (Patient not taking: Reported on 09/16/2022) 30 tablet 0   vitamin B-12 100 MCG tablet Take 1 tablet (100 mcg total) by mouth daily. (Patient not taking: Reported on 09/16/2022) 30 tablet 0    Lab Results:  No results found for this or any previous visit (from the past 48 hour(s)).   Blood Alcohol level:  Lab Results  Component Value Date   ETH <10 09/16/2022   ETH <10 09/14/2022    Physical Findings:  CIWA:    COWS:     Musculoskeletal: Strength & Muscle Tone: within normal limits Gait & Station: normal Patient leans: Front  Psychiatric Specialty Exam:  Presentation  General Appearance:  Casual; Neat  Eye Contact: Good  Speech: Clear and Coherent; Normal Rate  Speech Volume: Normal  Handedness: Right   Mood and Affect  Mood: Depressed; Anxious; Hopeless; Worthless  Affect: Congruent; Depressed   Thought Process  Thought Processes: Coherent; Goal Directed; Linear  Descriptions of Associations:Intact  Orientation:Full (Time, Place and Person)  Thought Content:Logical  History of Schizophrenia/Schizoaffective disorder:No data recorded Duration of Psychotic Symptoms:No data recorded Hallucinations:No data recorded  Ideas of  Reference:None  Suicidal Thoughts:No data recorded  Homicidal Thoughts:No data recorded   Sensorium  Memory: Immediate Good; Recent Good; Remote Good  Judgment: Intact  Insight: Present   Executive Functions  Concentration: Good  Attention Span: Good  Recall: Good  Fund of Knowledge: Good  Language: Good   Psychomotor Activity  Psychomotor Activity: No data recorded   Assets  Assets: Communication Skills; Desire for Improvement   Sleep  Sleep: No data recorded    Physical Exam: Physical Exam Vitals and nursing note reviewed.  Constitutional:      Appearance: Normal appearance.  HENT:     Head: Normocephalic and atraumatic.     Nose: Nose normal.  Cardiovascular:     Rate and Rhythm: Normal rate and regular rhythm.  Pulmonary:     Effort: Pulmonary effort  is normal.  Musculoskeletal:        General: Normal range of motion.     Cervical back: Normal range of motion.  Skin:    General: Skin is warm and dry.  Neurological:     Mental Status: He is alert and oriented to person, place, and time.    Review of Systems  Constitutional: Negative.   HENT: Negative.    Eyes: Negative.   Respiratory: Negative.    Cardiovascular: Negative.   Gastrointestinal: Negative.   Genitourinary: Negative.   Musculoskeletal: Negative.   Skin: Negative.   Neurological: Negative.   Endo/Heme/Allergies: Negative.   Psychiatric/Behavioral:  Positive for depression and suicidal ideas. The patient is nervous/anxious and has insomnia.    Blood pressure (!) 180/85, pulse 82, temperature (!) 97.4 F (36.3 C), resp. rate 18, SpO2 99 %. There is no height or weight on file to calculate BMI.   Medical Decision Making: Patient continues to require inpatient Mental healthcare.  We will seek bed placement at any Geropsychiatry unit.  Meanwhile we will continue to offer Sertraline and Trazodone.  We will fax out  records to facilities that has available beds.   Increase Sertraline to 50 mg po daily.  We will obtain EKG for QTC interval since Sertraline is increased. Disposition- Admit, Seek bed placement in a Geropsychiatry unit.Earney Navy, NP-PMHNP-BC 09/18/2022, 2:58 PM

## 2022-09-18 NOTE — ED Provider Notes (Signed)
Emergency Medicine Observation Re-evaluation Note  Glenn Barnett. is a 71 y.o. male, seen on rounds today.  Pt initially presented to the ED for complaints of Psychiatric Evaluation Currently, the patient is in the hallway resting.  Physical Exam  BP (!) 180/85   Pulse 82   Temp (!) 97.4 F (36.3 C)   Resp 18   SpO2 99%  Physical Exam   ED Course / MDM  EKG:   I have reviewed the labs performed to date as well as medications administered while in observation.  Recent changes in the last 24 hours include none.  Plan  Current plan is for placement.    Lorre Nick, MD 09/18/22 [phone]

## 2022-09-19 NOTE — Progress Notes (Signed)
Inpatient Behavioral Health Placement  Meets inpatient criteria per Earney Navy, NP. There are no beds at [facility]. Referral was sent to the following facilities;   Destination  Service Provider Address Phone Fax  [facility]  [address], [place] [address] [phone]  [facility]  [address] [place] [address] [phone]  [facility]  [address] [place] [address] [phone]  [facility]  [address] [place], [place] [address] [phone]  [facility]  [address] [place] [address] [phone]  [facility]  [address] [place] [address] [phone]  [facility]  [address], [place] [address] [phone]  [facility]  [address] [place] [address] [phone]  [facility]  [address] [place] [address] [phone]  [facility]  [address] [place], [place] [address] [phone]  [facility]  [address] [place] [address] [phone]  [facility]  [address] [place] [address] [phone]  [facility]  [address], [place] [address] [phone]  [facility]  [address] [place] [address] [phone]  [facility]  [address] [place] [address] [phone]  [facility]  [address] [place] [address] [phone]  [facility] [address]  [address], [place] [address] [phone]  [facility]   [address] [place] [address] [phone]  [facility]  [address] [place] [address] [phone]    Situation ongoing,  CSW will follow up.   Maryjean Ka, MSW, LCSWA 09/19/2022  @ 1:40 AM

## 2022-09-19 NOTE — ED Notes (Signed)
Spoke to representative at [facility] for placement. Requesting information faxed to facility.

## 2022-09-19 NOTE — ED Provider Notes (Addendum)
Emergency Medicine Observation Re-evaluation Note  Glenn Barnett. is a 71 y.o. male, seen on rounds today.  Pt initially presented to the ED for complaints of Psychiatric Evaluation Currently, the patient is resting.  Physical Exam  BP (!) 176/99   Pulse 64   Temp 98.5 F (36.9 C) (Oral)   Resp 16   SpO2 96%  Physical Exam General: Calm Cardiac: Well perfused Lungs: even respirations Psych: Calm  ED Course / MDM  EKG:EKG Interpretation  Date/Time:  Monday September 18 2022 21:38:19 EST Ventricular Rate:  67 PR Interval:  142 QRS Duration: 84 QT Interval:  388 QTC Calculation: 409 R Axis:   24 Text Interpretation: Normal sinus rhythm Cannot rule out Anterior infarct , age undetermined Abnormal ECG When compared with ECG of 23-Jun-2022 07:37, PREVIOUS ECG IS PRESENT Confirmed by Ernie Avena ([id number]) on 09/18/2022 10:00:50 PM  I have reviewed the labs performed to date as well as medications administered while in observation.  Recent changes in the last 24 hours include TTS recommending inpatient psych.  Plan  Current plan is for placement.  10:43 AM  Patient accepted to [facility] pending IVC, which I have filed. Patient in agreement for transfer. Dr. Fabio Neighbors is the accepting MD.     Maia Plan, MD 09/19/22 4742    Maia Plan, MD 09/19/22 1044

## 2022-09-19 NOTE — ED Notes (Signed)
[facility] stating they should be able to accept the patient after confirmation of medical clearance, requested paperwork faxed to facility.

## 2022-09-19 NOTE — Progress Notes (Addendum)
Pt was accepted to [facility] TODAY 09/19/22 PENDING IVC  Pt meets inpatient criteria per Dahlia Byes, NP  Attending Physician will be Dr. Fabio Neighbors   Report can be called to: [phone]  Pt can arrive after IVC paperwork is faxed [phone]   Care Team notified: Dahlia Byes, NP, Ilsa Iha, RN, and [address], LCSWA  [place], LCSWA 09/19/2022 @ 9:51 AM

## 2022-09-24 IMAGING — CT CT HEAD W/O CM
3 series · 16 of 47 positions shown, 19 images · non-contrast
Comparison: None.

CLINICAL DATA: Mental status changes

EXAM:
CT HEAD WITHOUT CONTRAST
TECHNIQUE: Contiguous axial images were obtained from the base of the skull
through the vertex without intravenous contrast.

[Series 2: head wo · axial · 0.47mm/px · z∈[-117,+28]mm · 10 of 35 slices shown, 13 images]
[im 3/35  brain]
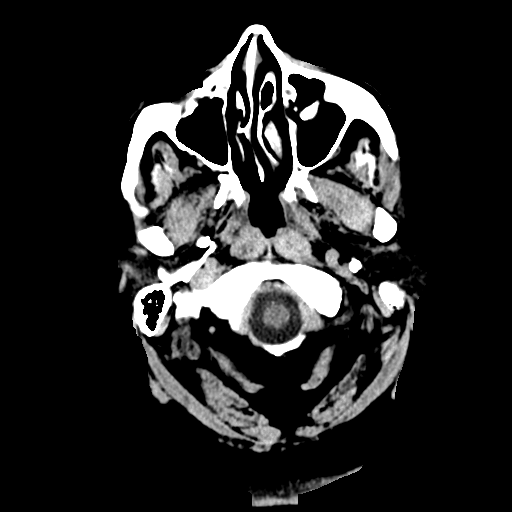
[im 3/35  bone]
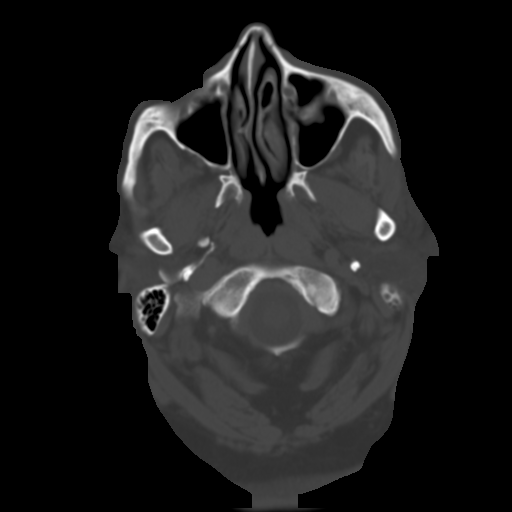
[im 6/35  brain]
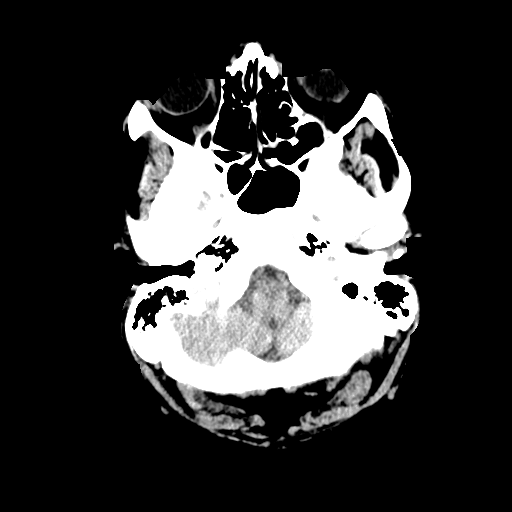
[im 10/35  brain]
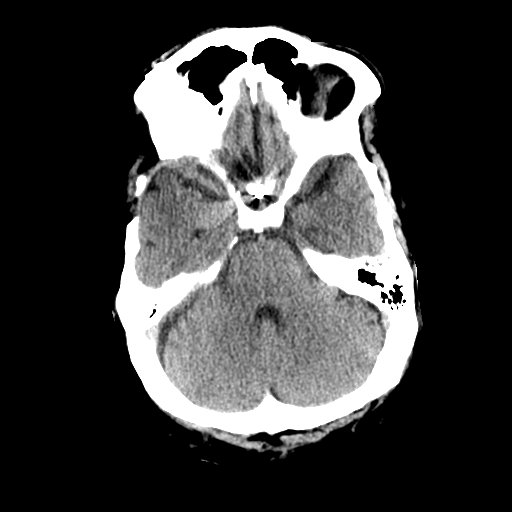
[im 12/35  brain]
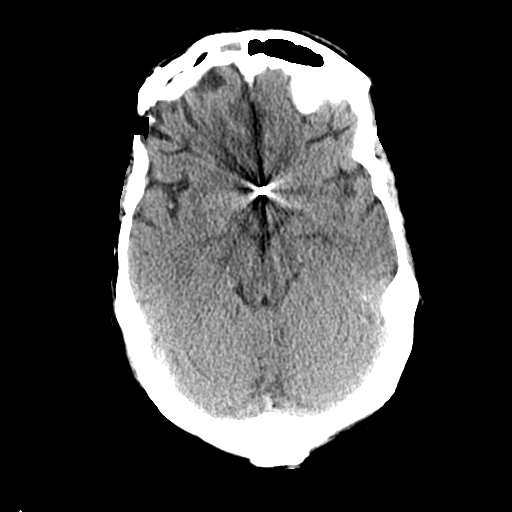
[im 16/35  brain]
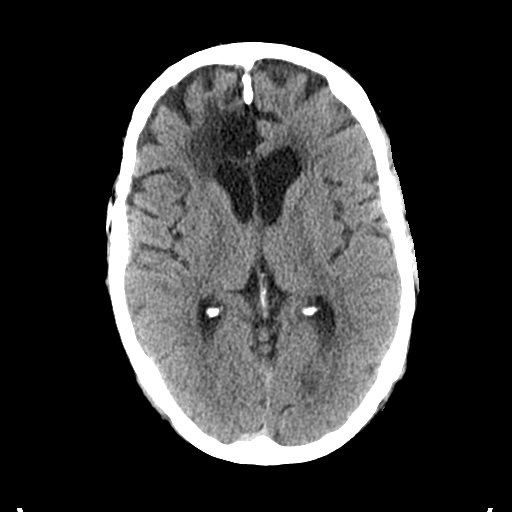
[im 16/35  bone]
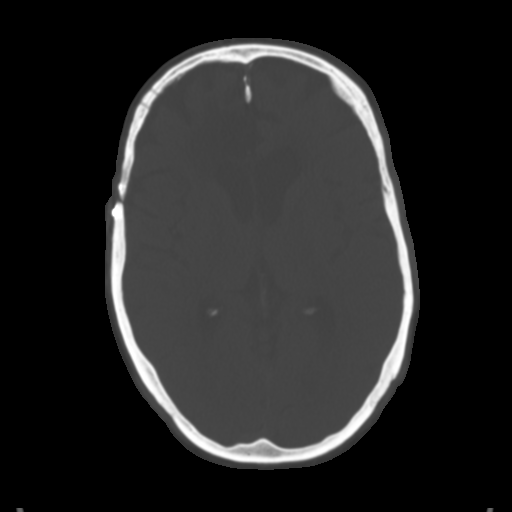
[im 19/35  brain]
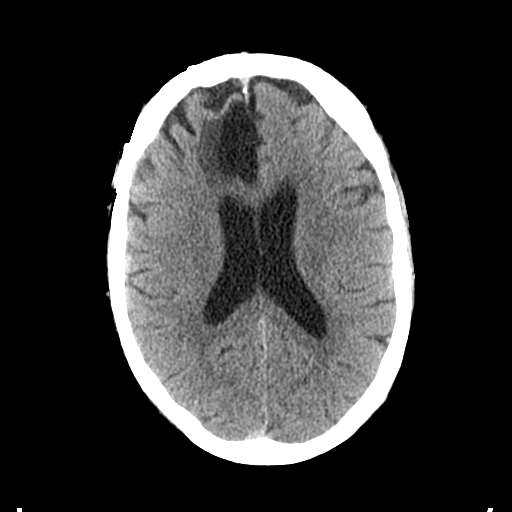
[im 23/35  brain]
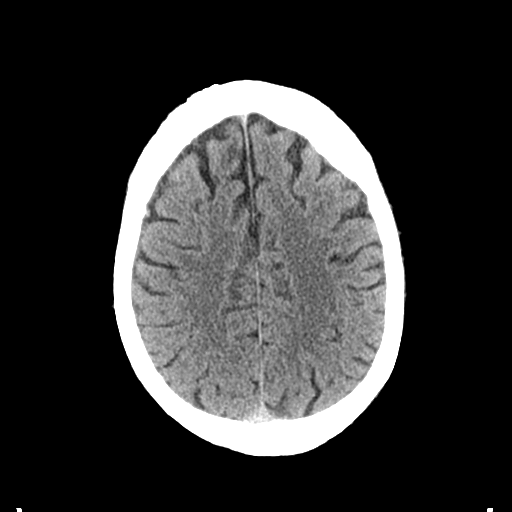
[im 26/35  brain]
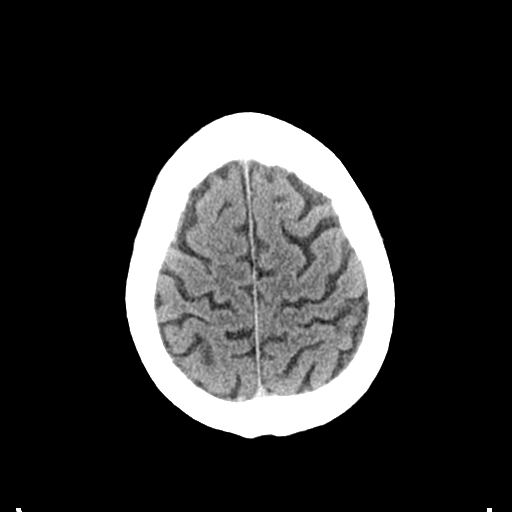
[im 29/35  brain]
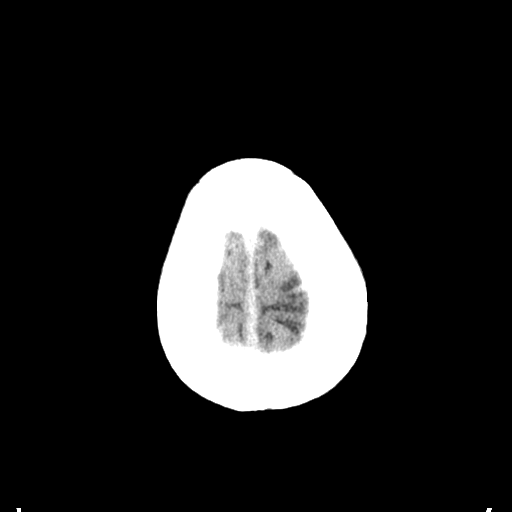
[im 29/35  bone]
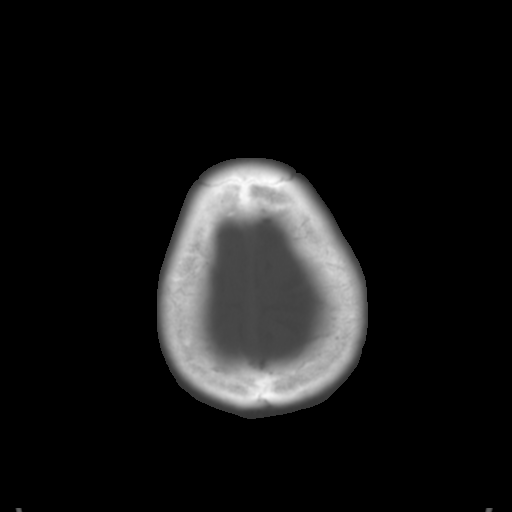
[im 32/35  brain]
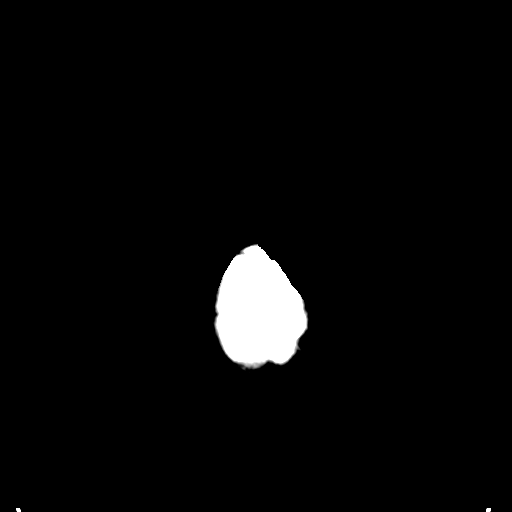

[Series 4: sagittal soft tissue · sagittal · 0.34mm/px · 3 of 60 slices shown]
[im 20/60  brain]
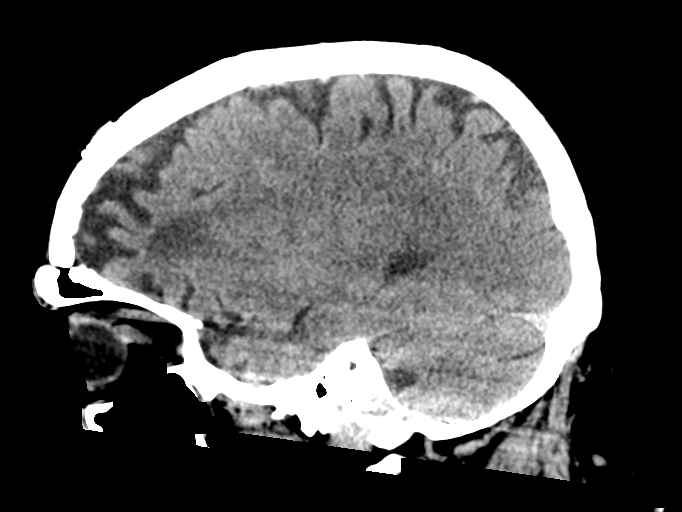
[im 30/60  brain]
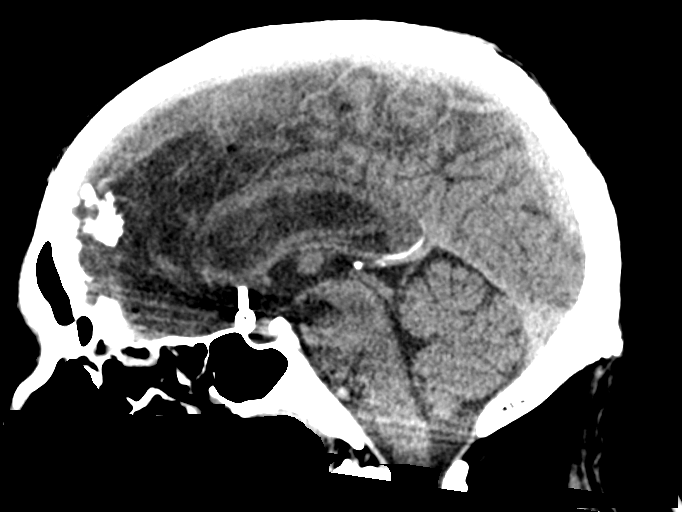
[im 40/60  brain]
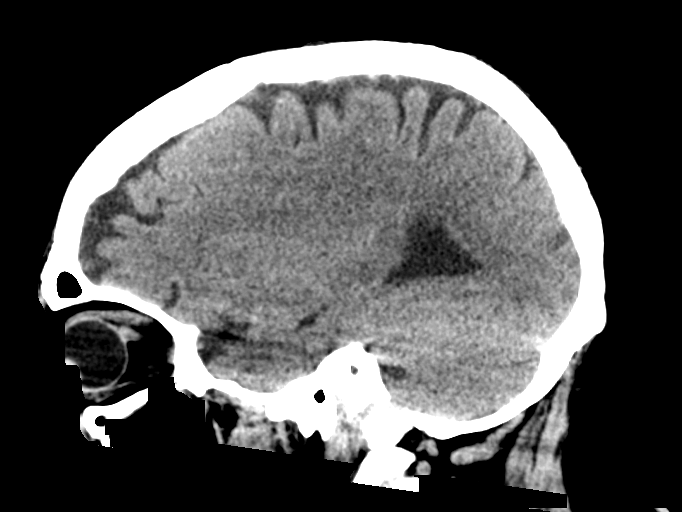

[Series 5: coronal soft tissue · coronal · 0.34mm/px · 3 of 78 slices shown]
[im 26/78  brain]
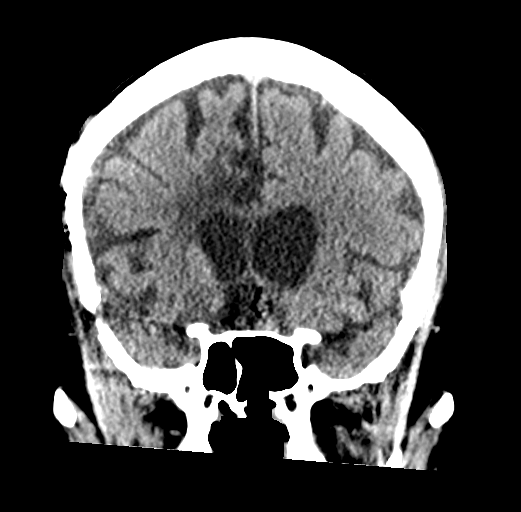
[im 35/78  brain]
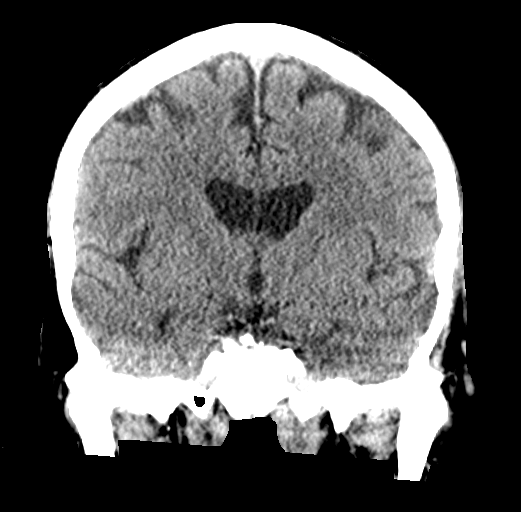
[im 43/78  brain]
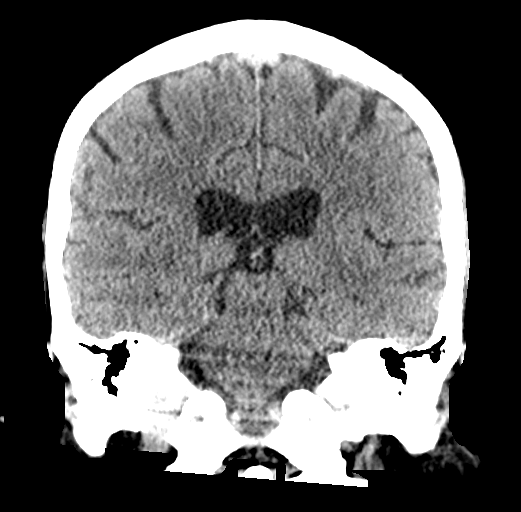

[16 of 47 positions shown; findings below may reference images not displayed]

FINDINGS: Brain: Encephalomalacia in the right frontal lobe. Mild age related
volume loss. No acute intracranial abnormality. Specifically, no
hemorrhage, hydrocephalus, mass lesion, acute infarction, or
significant intracranial injury.

Vascular: No hyperdense vessel or unexpected calcification. Aneurysm
clips in the region of the anterior communicating artery.

Skull: Prior right frontotemporal craniotomy.

Sinuses/Orbits: No acute findings

Other: None
IMPRESSION: Encephalomalacia in the right frontal lobe. Prior aneurysm clipping
in the region of the anterior communicating artery.

No acute intracranial abnormality.

## 2022-10-08 ENCOUNTER — Other Ambulatory Visit: Payer: Self-pay

## 2022-10-08 ENCOUNTER — Emergency Department (HOSPITAL_COMMUNITY)
Admission: EM | Admit: 2022-10-08 | Discharge: 2022-10-09 | Disposition: A | Discharge status: Home / Self Care | Payer: Medicare Other | Attending: Emergency Medicine | Admitting: Emergency Medicine

## 2022-10-08 DIAGNOSIS — Z79899 Other long term (current) drug therapy: Secondary | ICD-10-CM | POA: Diagnosis not present

## 2022-10-08 DIAGNOSIS — M25511 Pain in right shoulder: Secondary | ICD-10-CM | POA: Insufficient documentation

## 2022-10-08 DIAGNOSIS — F32A Depression, unspecified: Secondary | ICD-10-CM | POA: Insufficient documentation

## 2022-10-08 DIAGNOSIS — D72829 Elevated white blood cell count, unspecified: Secondary | ICD-10-CM | POA: Insufficient documentation

## 2022-10-08 DIAGNOSIS — F332 Major depressive disorder, recurrent severe without psychotic features: Secondary | ICD-10-CM | POA: Diagnosis present

## 2022-10-08 NOTE — ED Provider Triage Note (Signed)
Emergency Medicine Provider Triage Evaluation Note  Iva Posten. , a 71 y.o. male  was evaluated in triage.  Pt complains of burning pain in bilateral feet.  Patient endorses a history of prediabetes and has been told he has neuropathy in the past but does not remember being told he has neuropathy of the feet.  He thinks he is taking medication for this in the past but is unsure.  He is also here complaining of a general feeling of depression and sadness.  He denies SI, HI, hallucinations  Review of Systems  Positive: As above Negative: As above  Physical Exam  There were no vitals taken for this visit. Gen:   Awake, no distress   Resp:  Normal effort  MSK:   Moves extremities without difficulty  Other:    Medical Decision Making  Medically screening exam initiated at 10:07 PM.  Appropriate orders placed.  [name]. was informed that the remainder of the evaluation will be completed by another provider, this initial triage assessment does not replace that evaluation, and the importance of remaining in the ED until their evaluation is complete.     Pamala Duffel 10/08/22 2208

## 2022-10-08 NOTE — ED Triage Notes (Signed)
Patient coming to ED for evaluation of pain to bilateral feet and R shoulder.  States pain is "burning."  C/o depression.  No reports of SI/HI

## 2022-10-09 ENCOUNTER — Emergency Department (HOSPITAL_COMMUNITY): Payer: Medicare Other

## 2022-10-09 LAB — COMPREHENSIVE METABOLIC PANEL
ALT: 17 U/L (ref 0–44)
AST: 20 U/L (ref 15–41)
Albumin: 3.8 g/dL (ref 3.5–5.0)
Alkaline Phosphatase: 82 U/L (ref 38–126)
Anion gap: 6 (ref 5–15)
BUN: 16 mg/dL (ref 8–23)
CO2: 27 mmol/L (ref 22–32)
Calcium: 8.6 mg/dL — ABNORMAL LOW (ref 8.9–10.3)
Chloride: 104 mmol/L (ref 98–111)
Creatinine, Ser: 1.13 mg/dL (ref 0.61–1.24)
GFR, Estimated: >60 mL/min (ref >60)
Glucose, Bld: 115 mg/dL — ABNORMAL HIGH (ref 70–99)
Potassium: 4.5 mmol/L (ref 3.5–5.1)
Sodium: 137 mmol/L (ref 135–145)
Total Bilirubin: 0.9 mg/dL (ref 0.3–1.2)
Total Protein: 7 g/dL (ref 6.5–8.1)

## 2022-10-09 LAB — CBC WITH DIFFERENTIAL/PLATELET
Abs Immature Granulocytes: 0.06 10*3/uL (ref 0.00–0.07)
Basophils Absolute: 0 10*3/uL (ref 0.0–0.1)
Basophils Relative: 0 %
Eosinophils Absolute: 0.2 10*3/uL (ref 0.0–0.5)
Eosinophils Relative: 1 %
HCT: 46.8 % (ref 39.0–52.0)
Hemoglobin: 15.2 g/dL (ref 13.0–17.0)
Immature Granulocytes: 0 %
Lymphocytes Relative: 12 %
Lymphs Abs: 1.7 10*3/uL (ref 0.7–4.0)
MCH: 29.6 pg (ref 26.0–34.0)
MCHC: 32.5 g/dL (ref 30.0–36.0)
MCV: 91.2 fL (ref 80.0–100.0)
Monocytes Absolute: 1.5 10*3/uL — ABNORMAL HIGH (ref 0.1–1.0)
Monocytes Relative: 11 %
Neutro Abs: 10.2 10*3/uL — ABNORMAL HIGH (ref 1.7–7.7)
Neutrophils Relative %: 76 %
Platelets: 296 10*3/uL (ref 150–400)
RBC: 5.13 MIL/uL (ref 4.22–5.81)
RDW: 13.2 % (ref 11.5–15.5)
WBC: 13.5 10*3/uL — ABNORMAL HIGH (ref 4.0–10.5)
nRBC: 0 % (ref 0.0–0.2)

## 2022-10-09 LAB — RAPID URINE DRUG SCREEN, HOSP PERFORMED
Amphetamines: NOT DETECTED
Barbiturates: NOT DETECTED
Benzodiazepines: NOT DETECTED
Cocaine: NOT DETECTED
Opiates: NOT DETECTED
Tetrahydrocannabinol: NOT DETECTED

## 2022-10-09 LAB — ETHANOL: Alcohol, Ethyl (B): <10 mg/dL (ref <10)

## 2022-10-09 MED ORDER — TRAZODONE HCL 50 MG PO TABS: 50.0000 mg | ORAL_TABLET | Freq: Every evening | ORAL | 0 refills | Status: DC | PRN

## 2022-10-09 MED ORDER — SERTRALINE HCL 50 MG PO TABS: 50.0000 mg | ORAL_TABLET | Freq: Every day | ORAL | 0 refills | Status: DC

## 2022-10-09 MED ORDER — TRAZODONE HCL 100 MG PO TABS: 50.0000 mg | ORAL_TABLET | Freq: Every evening | ORAL | Status: DC | PRN

## 2022-10-09 MED ORDER — SERTRALINE HCL 50 MG PO TABS
50.0000 mg | ORAL_TABLET | Freq: Every day | ORAL | Status: DC
  Administered 2022-10-09: 50 mg via ORAL
  Filled 2022-10-09: qty 1

## 2022-10-09 NOTE — ED Notes (Signed)
Patient repeatedly ambulating to nurses station requesting bigger scrubs, coffee, drink and food.

## 2022-10-09 NOTE — Discharge Summary (Addendum)
[facility] Psych ED Discharge  10/09/2022 11:17 AM [name].  MRN:  [id number]  Principal Problem: Major depressive disorder, recurrent severe without psychotic features [facility]) Discharge Diagnoses: Principal Problem:   Major depressive disorder, recurrent severe without psychotic features ([place])  Clinical Impression:  Final diagnoses:  Depression, unspecified depression type  Acute pain of right shoulder   Subjective: Male patient, 72 years old came to the ER with hx of Depression Anxiety, Bipolar and  homelessness with complaint of Bilateral feet and R shoulder pain.  During assessment he also complained of feeling depressed.  He was in the ER here December 9th through 12 th and was transferred to [facility] for inpatient Geropsychiatry  hospitalization. Patient was seen this morning calm, cooperative and reported Depression without suicidal or homicidal ideation.  He also reports that he is homeless but has his car.  He reports that after his hospitalization at [facility] he moved into s shelter.  He also reports that he does not know which shelter he was in and he believes he drove himself to the ER last night.  He has not been taking his Zoloft and Trazodone for Depression and sleep.  He also reports that he has been having memory issues off and on.  He reports poor sleep but very good appetite.  Since his arrival to the ER he has been eating and drinking and tolerating so. We discussed the need to establish care in an outpatient setting and to also seek Neuropsychology assessment for memory evaluation.  Patient again Denies SI/HI/AVH and no mention of Paranoia. We discussed safety plan-call 911 or 988 for any mental health crisis including but not limited to suicide ideation.  Go to [place] for outpatient care and medication management or to any ER for urgent mental health crisis. Discussed discharge and safety plan with DR Leverne Humbles, Psychiatrist who is in  agreement.   Referral to [facility] and [facility] given.  Information for Shelters in [place] also offered.  Patient is Psychiatrically cleared.   ED Assessment Time Calculation: Start Time: 1025 Stop Time: 1050 Total Time in Minutes (Assessment Completion): 25   Past Psychiatric History: Diagnosis of Anxiety and Bipolar, Depression, anxiety.  No outpatient Psychiatrist and he was hospitalized at [facility] last month from this ER.  Past Medical History:  Past Medical History:  Diagnosis Date   Brain aneurysm    Diabetes mellitus without complication ([place])     Past Surgical History:  Procedure Laterality Date   BRAIN SURGERY     Family History: No family history on file. Family Psychiatric  History: UNKNOWN Social History:  Social History   Substance and Sexual Activity  Alcohol Use Not Currently     Social History   Substance and Sexual Activity  Drug Use Not Currently    Social History   Socioeconomic History   Marital status: Divorced    Spouse name: Not on file   Number of children: Not on file   Years of education: Not on file   Highest education level: Not on file  Occupational History   Not on file  Tobacco Use   Smoking status: Never   Smokeless tobacco: Never  Vaping Use   Vaping Use: Never used  Substance and Sexual Activity   Alcohol use: Not Currently   Drug use: Not Currently   Sexual activity: Not on file  Other Topics Concern   Not on file  Social History Narrative  Not on file   Social Determinants of Health   Financial Resource Strain: Not on file  Food Insecurity: Not on file  Transportation Needs: Not on file  Physical Activity: Not on file  Stress: Not on file  Social Connections: Not on file    Tobacco Cessation:  N/A, patient does not currently use tobacco products  Current Medications: Current Facility-Administered Medications  Medication Dose Route Frequency Provider Last Rate Last Admin    sertraline (ZOLOFT) tablet 50 mg  50 mg Oral Daily Numa Schroeter C, NP       traZODone (DESYREL) tablet 50 mg  50 mg Oral QHS PRN Earney Navy, NP       Current Outpatient Medications  Medication Sig Dispense Refill   albuterol (VENTOLIN HFA) 108 (90 Base) MCG/ACT inhaler Inhale 2 puffs into the lungs every 6 (six) hours as needed for wheezing or shortness of breath. (Patient not taking: Reported on 09/16/2022) 8 g 2   buPROPion (WELLBUTRIN) 100 MG tablet Take 100 mg by mouth daily.     cholecalciferol (CHOLECALCIFEROL) 25 MCG tablet Take 1 tablet (1,000 Units total) by mouth daily. (Patient not taking: Reported on 09/16/2022) 30 tablet 0   gabapentin (NEURONTIN) 100 MG capsule Take 100 mg by mouth 3 (three) times daily.     metFORMIN (GLUCOPHAGE) 500 MG tablet Take 1 tablet (500 mg total) by mouth 2 (two) times daily with a meal. 60 tablet 2   sertraline (ZOLOFT) 50 MG tablet Take 1 tablet (50 mg total) by mouth daily. 30 tablet 0   traZODone (DESYREL) 50 MG tablet Take 1 tablet (50 mg total) by mouth at bedtime as needed for up to 15 days for sleep. 15 tablet 0   vitamin B-12 100 MCG tablet Take 1 tablet (100 mcg total) by mouth daily. (Patient not taking: Reported on 09/16/2022) 30 tablet 0   PTA Medications: (Not in a hospital admission)   [place] Scale:  Flowsheet Row ED from 10/08/2022 in [facility] Most recent reading at 10/08/2022 10:09 PM ED from 09/16/2022 in [facility] Most recent reading at 09/19/2022 11:19 AM ED from 09/16/2022 in [facility] Most recent reading at 09/16/2022  2:43 AM  C-SSRS RISK CATEGORY No Risk Moderate Risk Error: Q3, 4, or 5 should not be populated when Q2 is No       Musculoskeletal: Strength & Muscle Tone: within normal limits Gait & Station: normal Patient leans: Front  Psychiatric Specialty Exam: Presentation  General Appearance:  Casual;  Fairly Groomed  Eye Contact: Good  Speech: Clear and Coherent; Normal Rate  Speech Volume: Normal  Handedness: Right   Mood and Affect  Mood: Depressed  Affect: Congruent   Thought Process  Thought Processes: Coherent; Goal Directed; Linear  Descriptions of Associations:Intact  Orientation:Partial  Thought Content:Logical  History of Schizophrenia/Schizoaffective disorder:No data recorded Duration of Psychotic Symptoms:No data recorded Hallucinations:Hallucinations: None  Ideas of Reference:None  Suicidal Thoughts:Suicidal Thoughts: No  Homicidal Thoughts:Homicidal Thoughts: No   Sensorium  Memory: Immediate Fair; Recent Fair; Remote Fair  Judgment: Intact  Insight: Present   Executive Functions  Concentration: Good  Attention Span: Good  Recall: Good  Fund of Knowledge: Good  Language: Good   Psychomotor Activity  Psychomotor Activity: Psychomotor Activity: Normal   Assets  Assets: Communication Skills; Financial Resources/Insurance   Sleep  Sleep: Sleep: Fair    Physical Exam: Physical Exam Vitals and nursing note reviewed.  Constitutional:  Appearance: Normal appearance.  HENT:     Head: Normocephalic and atraumatic.     Nose: Nose normal.  Cardiovascular:     Rate and Rhythm: Normal rate and regular rhythm.  Pulmonary:     Effort: Pulmonary effort is normal.  Musculoskeletal:        General: Normal range of motion.     Cervical back: Normal range of motion.     Comments: Came in for bilateral and right shoulder pain.  Skin:    General: Skin is warm and dry.  Neurological:     Mental Status: He is alert and oriented to person, place, and time.    Review of Systems  Constitutional: Negative.   HENT: Negative.    Eyes: Negative.   Respiratory: Negative.    Cardiovascular: Negative.   Gastrointestinal: Negative.   Genitourinary: Negative.   Musculoskeletal:        Bilateral foot and Right  shoulder pain.  Skin: Negative.   Neurological: Negative.   Endo/Heme/Allergies: Negative.   Psychiatric/Behavioral:  Positive for depression.    Blood pressure (!) 168/104, pulse 75, temperature 98.3 F (36.8 C), temperature source Oral, resp. rate 18, SpO2 95 %. There is no height or weight on file to calculate BMI.   Demographic Factors:  Male, Age 32 or older, Low socioeconomic status, Living alone, and Unemployed  Loss Factors: Financial problems/change in socioeconomic status and Homeless  Historical Factors: NA  Risk Reduction Factors:   Positive social support  Continued Clinical Symptoms:  Depression:   Insomnia  Cognitive Features That Contribute To Risk:  None    Suicide Risk:  Minimal: No identifiable suicidal ideation.  Patients presenting with no risk factors but with morbid ruminations; may be classified as minimal risk based on the severity of the depressive symptoms    Plan Of Care/Follow-up recommendations:  Activity:  as tolerated Diet:  Diabetic diet  Medical Decision Making: Patient is Psychiatrically cleared.  He denies feeling SI/HI/AVH and no mention of Paranoia.  He has been offered referral to [place], call 911 or 988 for any mental health crisis.  Patient verbalized understanding.  Patient is advised to walk in to [place] between the hours of 7 am to 11 am for treatment and establish care.  His Zoloft and Trazodone prescription are sent to his Pharmacy.     Problem 1: Recurrent Major Depressive disorder, moderate without Psychotic features.  Disposition: Psychiatrically cleared.  Delfin Gant, NP--PMHNP-BC 10/09/2022, 11:17 AM

## 2022-10-09 NOTE — ED Notes (Signed)
Writer went over discharge with patient including medications/which pharmacy to pick up at/how to take medications.reviewed resources for shelters/meals, behavioral health treatment centers and walk in hours. Patient verbalized understanding and had no further questions.

## 2022-10-09 NOTE — ED Notes (Signed)
Patient ambulated to secure area without difficulty. Patient given diet ginger ale per their request. Patient endorses right shoulder and bilateral foot pain 8/10. States gabapentin usually takes care of this. States they take gabapentin daily, and has taken it today. Denies any SI/HI. Denies depression.

## 2022-10-09 NOTE — Discharge Instructions (Addendum)
Call 911 or 988 for any mental health crisis.   You may walk in to [place] between the hours of 7 am to 11 am for treatment and establish care.   [facility] [address], [place], [place] [phone]

## 2022-10-09 NOTE — ED Provider Notes (Signed)
[place] DEPT Provider Note   CSN: [id number] Arrival date & time: 10/08/22  2156     History  Chief Complaint  Patient presents with   Pain   Depression    Glenn Barnett. is a 72 y.o. male.   Depression     Patient with medical history of diabetes and depression presents to the emergency department due to depression.  States she has been feeling "down" for the last week.  States she has been homeless and sleeping in his car which is exacerbated his feelings of sadness.  He denies any current plan, SI, homicidal ideations, delusions, alcohol consumption or illicit drug use.  He is not on any medication for depression.  He also endorses pain to the right shoulder.  Thinks this may have happened from sleeping his car and having to navigate homelessness.  No chest pain.  Worse with movement.  Denies any recent falls or injuries to the shoulder.  Home Medications Prior to Admission medications   Medication Sig Start Date End Date Taking? Authorizing Provider  albuterol (VENTOLIN HFA) 108 (90 Base) MCG/ACT inhaler Inhale 2 puffs into the lungs every 6 (six) hours as needed for wheezing or shortness of breath. Patient not taking: Reported on 09/16/2022 06/25/22   Damita Lack, MD  buPROPion (WELLBUTRIN) 100 MG tablet Take 100 mg by mouth daily.    [provider]  cholecalciferol (CHOLECALCIFEROL) 25 MCG tablet Take 1 tablet (1,000 Units total) by mouth daily. Patient not taking: Reported on 09/16/2022 06/25/22   Damita Lack, MD  gabapentin (NEURONTIN) 100 MG capsule Take 100 mg by mouth 3 (three) times daily. 12/20/21   [provider]  metFORMIN (GLUCOPHAGE) 500 MG tablet Take 1 tablet (500 mg total) by mouth 2 (two) times daily with a meal. 06/21/22 09/19/22  Naaman Plummer, MD  vitamin B-12 100 MCG tablet Take 1 tablet (100 mcg total) by mouth daily. Patient not taking: Reported on 09/16/2022 06/25/22   Damita Lack, MD      Allergies    Morphine and Other    Review of Systems   Review of Systems  Psychiatric/Behavioral:  Positive for depression.     Physical Exam Updated Vital Signs BP (!) 168/104   Pulse 75   Temp 98.3 F (36.8 C) (Oral)   Resp 18   SpO2 95%  Physical Exam Vitals and nursing note reviewed. Exam conducted with a chaperone present.  Constitutional:      Appearance: Normal appearance.  HENT:     Head: Normocephalic and atraumatic.  Eyes:     General: No scleral icterus.       Right eye: No discharge.        Left eye: No discharge.     Extraocular Movements: Extraocular movements intact.     Pupils: Pupils are equal, round, and reactive to light.  Cardiovascular:     Rate and Rhythm: Normal rate and regular rhythm.     Pulses: Normal pulses.     Heart sounds: Normal heart sounds.     No friction rub. No gallop.  Pulmonary:     Effort: Pulmonary effort is normal. No respiratory distress.     Breath sounds: Normal breath sounds.  Abdominal:     General: Abdomen is flat. Bowel sounds are normal. There is no distension.     Palpations: Abdomen is soft.     Tenderness: There is no abdominal tenderness.  Musculoskeletal:  General: Tenderness present.     Comments: Tender to palpation right shoulder along deltoid, tolerates passive ROM, decreased active ROM secondary to pain.    Skin:    General: Skin is warm and dry.     Capillary Refill: Capillary refill takes less than 2 seconds.     Coloration: Skin is not jaundiced.  Neurological:     Mental Status: He is alert. Mental status is at baseline.     Coordination: Coordination normal.     ED Results / Procedures / Treatments   Labs (all labs ordered are listed, but only abnormal results are displayed) Labs Reviewed  COMPREHENSIVE METABOLIC PANEL - Abnormal; Notable for the following components:      Result Value   Glucose, Bld 115 (*)    Calcium 8.6 (*)    All other components within normal  limits  CBC WITH DIFFERENTIAL/PLATELET - Abnormal; Notable for the following components:   WBC 13.5 (*)    Neutro Abs 10.2 (*)    Monocytes Absolute 1.5 (*)    All other components within normal limits  ETHANOL  RAPID URINE DRUG SCREEN, HOSP PERFORMED    EKG None  Radiology DG Shoulder Right  Result Date: 10/09/2022 CLINICAL DATA:  72 year old male with history of right shoulder burning pain. EXAM: RIGHT SHOULDER - 2+ VIEW COMPARISON:  No priors. FINDINGS: Three views of the right shoulder demonstrate no acute displaced fracture, subluxation or dislocation. Degenerative changes of osteoarthritis are noted in the glenohumeral and acromioclavicular joints. Humeral head is high-riding, suggesting underlying rotator cuff disease. IMPRESSION: 1. No acute radiographic abnormality of the right shoulder. 2. 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 Electronically Signed   By: Vinnie Langton M.D.   On: 10/09/2022 08:30    Procedures Procedures    Medications Ordered in ED Medications  sertraline (ZOLOFT) tablet 50 mg (has no administration in time range)  traZODone (DESYREL) tablet 50 mg (has no administration in time range)    ED Course/ Medical Decision Making/ A&P Clinical Course as of 10/09/22 1056  Mon Oct 09, 2022  4742 DG Shoulder Right Suspect rotator cuff disease.  Agree with radiologist. [HS]  [phone] CBC with Diff(!) Slight leukocytosis, no anemia [HS]  0833 Comprehensive metabolic panel(!) No gross electrolyte abnormality, AKI or transaminitis [HS]  0834 Ethanol neg [HS]  0834 Urine rapid drug screen (hosp performed) neg [HS]    Clinical Course User Index [HS] Sherrill Raring, PA-C                           Medical Decision Making Amount and/or Complexity of Data Reviewed Labs: ordered. Decision-making details documented in ED Course. Radiology: ordered. Decision-making details  documented in ED Course.   Patient presents due to depression.  He has also been right shoulder pain.  He is contracted to safety and meets no indication for IVC.  The shoulder pain is reproducible and seems musculoskeletal.  He is neurovascular intact.  Will proceed with plain film.  Will check basic labs for evaluation prior to medical clearance.  I ordered, viewed and interpreted laboratory workup as documented ED course.  Plain film ordered, notable for findings consistent with rotator cuff injury.  Considered atypical ACS given age, he is not having any chest pain or shortness of breath.  EKG is also nonischemic.  Do not think additional workup is indicated regarding the shoulder pain as it is  consistent with musculoskeletal etiology.  At this time patient is medically clear and appropriate for TTS evaluation.  Home meds not ordered yet as we are pending med reconciliation.        Final Clinical Impression(s) / ED Diagnoses Final diagnoses:  Depression, unspecified depression type  Acute pain of right shoulder    Rx / DC Orders ED Discharge Orders     None         Theron Arista, PA-C 10/09/22 1056    Wynetta Fines, MD 10/09/22 1330

## 2022-10-10 ENCOUNTER — Emergency Department (HOSPITAL_COMMUNITY)
Admission: EM | Admit: 2022-10-10 | Discharge: 2022-10-11 | Discharge status: Against Medical Advice | Payer: Medicare Other | Source: Home / Self Care

## 2022-10-10 ENCOUNTER — Other Ambulatory Visit: Payer: Self-pay

## 2022-10-10 ENCOUNTER — Emergency Department (HOSPITAL_COMMUNITY): Payer: Medicare Other

## 2022-10-10 ENCOUNTER — Encounter (HOSPITAL_COMMUNITY): Payer: Self-pay

## 2022-10-10 ENCOUNTER — Emergency Department (HOSPITAL_COMMUNITY)
Admission: EM | Admit: 2022-10-10 | Discharge: 2022-10-10 | Disposition: A | Discharge status: Home / Self Care | Payer: Medicare Other | Attending: Emergency Medicine | Admitting: Emergency Medicine

## 2022-10-10 DIAGNOSIS — Z5901 Sheltered homelessness: Secondary | ICD-10-CM | POA: Insufficient documentation

## 2022-10-10 DIAGNOSIS — Z79899 Other long term (current) drug therapy: Secondary | ICD-10-CM | POA: Insufficient documentation

## 2022-10-10 DIAGNOSIS — R0602 Shortness of breath: Secondary | ICD-10-CM | POA: Insufficient documentation

## 2022-10-10 DIAGNOSIS — R55 Syncope and collapse: Secondary | ICD-10-CM | POA: Insufficient documentation

## 2022-10-10 DIAGNOSIS — R42 Dizziness and giddiness: Secondary | ICD-10-CM | POA: Insufficient documentation

## 2022-10-10 DIAGNOSIS — I10 Essential (primary) hypertension: Secondary | ICD-10-CM | POA: Insufficient documentation

## 2022-10-10 DIAGNOSIS — F329 Major depressive disorder, single episode, unspecified: Secondary | ICD-10-CM | POA: Insufficient documentation

## 2022-10-10 DIAGNOSIS — F32A Depression, unspecified: Secondary | ICD-10-CM

## 2022-10-10 DIAGNOSIS — Z1152 Encounter for screening for COVID-19: Secondary | ICD-10-CM | POA: Insufficient documentation

## 2022-10-10 DIAGNOSIS — Z5321 Procedure and treatment not carried out due to patient leaving prior to being seen by health care provider: Secondary | ICD-10-CM | POA: Insufficient documentation

## 2022-10-10 LAB — CBC
HCT: 54.5 % — ABNORMAL HIGH (ref 39.0–52.0)
Hemoglobin: 17.1 g/dL — ABNORMAL HIGH (ref 13.0–17.0)
MCH: 29.1 pg (ref 26.0–34.0)
MCHC: 31.4 g/dL (ref 30.0–36.0)
MCV: 92.8 fL (ref 80.0–100.0)
Platelets: 342 10*3/uL (ref 150–400)
RBC: 5.87 MIL/uL — ABNORMAL HIGH (ref 4.22–5.81)
RDW: 13.3 % (ref 11.5–15.5)
WBC: 14 10*3/uL — ABNORMAL HIGH (ref 4.0–10.5)
nRBC: 0 % (ref 0.0–0.2)

## 2022-10-10 LAB — URINALYSIS, ROUTINE W REFLEX MICROSCOPIC
Bacteria, UA: NONE SEEN
Bilirubin Urine: NEGATIVE
Glucose, UA: NEGATIVE mg/dL
Hgb urine dipstick: NEGATIVE
Ketones, ur: NEGATIVE mg/dL
Leukocytes,Ua: NEGATIVE
Nitrite: NEGATIVE
Protein, ur: 30 mg/dL — AB
Specific Gravity, Urine: 1.023 (ref 1.005–1.030)
pH: 5 (ref 5.0–8.0)

## 2022-10-10 LAB — RESP PANEL BY RT-PCR (RSV, FLU A&B, COVID)  RVPGX2
Influenza A by PCR: NEGATIVE
Influenza B by PCR: NEGATIVE
Resp Syncytial Virus by PCR: NEGATIVE
SARS Coronavirus 2 by RT PCR: NEGATIVE

## 2022-10-10 LAB — BASIC METABOLIC PANEL
Anion gap: 11 (ref 5–15)
BUN: 18 mg/dL (ref 8–23)
CO2: 24 mmol/L (ref 22–32)
Calcium: 9.1 mg/dL (ref 8.9–10.3)
Chloride: 103 mmol/L (ref 98–111)
Creatinine, Ser: 1.27 mg/dL — ABNORMAL HIGH (ref 0.61–1.24)
GFR, Estimated: >60 mL/min (ref >60)
Glucose, Bld: 122 mg/dL — ABNORMAL HIGH (ref 70–99)
Potassium: 4.8 mmol/L (ref 3.5–5.1)
Sodium: 138 mmol/L (ref 135–145)

## 2022-10-10 MED ORDER — TRAZODONE HCL 50 MG PO TABS
50.0000 mg | ORAL_TABLET | Freq: Every day | ORAL | Status: DC
  Administered 2022-10-10: 50 mg via ORAL
  Filled 2022-10-10: qty 1
  Filled 2022-10-10: qty 3

## 2022-10-10 MED ORDER — LIDOCAINE 5 % EX PTCH
2.0000 | MEDICATED_PATCH | Freq: Once | CUTANEOUS | Status: DC
  Administered 2022-10-10: 2 via TRANSDERMAL
  Filled 2022-10-10: qty 2

## 2022-10-10 MED ORDER — SERTRALINE HCL 50 MG PO TABS
50.0000 mg | ORAL_TABLET | Freq: Every day | ORAL | Status: DC
  Filled 2022-10-10 (×2): qty 3

## 2022-10-10 MED ORDER — METFORMIN HCL 500 MG PO TABS: 500.0000 mg | ORAL_TABLET | Freq: Two times a day (BID) | ORAL | 0 refills | Status: AC

## 2022-10-10 MED ORDER — SODIUM CHLORIDE 0.9 % IV BOLUS
1000.0000 mL | Freq: Once | INTRAVENOUS | Status: AC
  Administered 2022-10-10: 1000 mL via INTRAVENOUS

## 2022-10-10 MED ORDER — METFORMIN HCL 500 MG PO TABS
500.0000 mg | ORAL_TABLET | Freq: Two times a day (BID) | ORAL | Status: DC
  Filled 2022-10-10: qty 6

## 2022-10-10 MED ORDER — SERTRALINE HCL 50 MG PO TABS: 50.0000 mg | ORAL_TABLET | Freq: Every day | ORAL | 0 refills | Status: AC

## 2022-10-10 MED ORDER — LISINOPRIL 20 MG PO TABS: 20.0000 mg | ORAL_TABLET | Freq: Every day | ORAL | 0 refills | Status: DC

## 2022-10-10 MED ORDER — TRAZODONE HCL 50 MG PO TABS: 50.0000 mg | ORAL_TABLET | Freq: Every evening | ORAL | 0 refills | Status: AC | PRN

## 2022-10-10 MED ORDER — LISINOPRIL 20 MG PO TABS
20.0000 mg | ORAL_TABLET | Freq: Every day | ORAL | Status: DC
  Administered 2022-10-10: 20 mg via ORAL
  Filled 2022-10-10: qty 3
  Filled 2022-10-10: qty 1

## 2022-10-10 MED ORDER — SERTRALINE HCL 50 MG PO TABS
50.0000 mg | ORAL_TABLET | Freq: Every day | ORAL | Status: DC
  Filled 2022-10-10: qty 1

## 2022-10-10 MED ORDER — LISINOPRIL 20 MG PO TABS: 20.0000 mg | ORAL_TABLET | Freq: Every day | ORAL | 0 refills | Status: AC

## 2022-10-10 MED ORDER — METFORMIN HCL 500 MG PO TABS: 500.0000 mg | ORAL_TABLET | Freq: Two times a day (BID) | ORAL | 0 refills | Status: DC

## 2022-10-10 MED ORDER — AMLODIPINE BESYLATE 5 MG PO TABS
5.0000 mg | ORAL_TABLET | Freq: Once | ORAL | Status: AC
  Administered 2022-10-10: 5 mg via ORAL
  Filled 2022-10-10: qty 1

## 2022-10-10 NOTE — Discharge Instructions (Addendum)
You were seen in the emergency department for dizziness and depression.  As we discussed your workup today was reassuring.  Your blood pressure was high, we gave you a dose of blood pressure medication and this seemed to help.  I am refilling your previous lisinopril and metformin prescriptions.   Yesterday at your ER visit you were prescribed Zoloft and trazodone for your depression.  Please pick this up and start taking them. All of these medications were sent to [facility] on [place].   I have attached the contact information for [facility], for you to establish care with a primary care physician.

## 2022-10-10 NOTE — ED Provider Triage Note (Addendum)
Emergency Medicine Provider Triage Evaluation Note  Case Glenn Barnett. , a 72 y.o. male  was evaluated in triage.  Pt complains of dizziness.  Started 2 days ago.  Denies room spinning sensation.  Characterizes more of loss of balance.  Dizziness comes and goes.  Also endorsing associated shortness of breath.  Denies chest pain.  Denies cough and congestion.  Denies fever.  Patient is homeless at this time.  Was evaluated at [facility] long ED 2 days ago for depression.  Review of Systems  Positive: See above Negative: See above  Physical Exam  There were no vitals taken for this visit. Gen:   Awake, no distress   Resp:  Normal effort  MSK:   Moves extremities without difficulty  Other:  No FND  Medical Decision Making  Medically screening exam initiated at 9:17 AM.  Appropriate orders placed.  Glenn Barnett. was informed that the remainder of the evaluation will be completed by another provider, this initial triage assessment does not replace that evaluation, and the importance of remaining in the ED until their evaluation is complete.  Work up started    Glenn Barnett, [place] 10/10/22 [phone]

## 2022-10-10 NOTE — ED Provider Notes (Signed)
[facility] EMERGENCY DEPARTMENT Provider Note   CSN: [id number] Arrival date & time: 10/10/22  [id number]     History  Chief Complaint  Patient presents with   Dizziness    Glenn Reath. is a 72 y.o. male who presents the emergency department complaining of dizziness and depression.  Patient states both have been going on for "quite a while".  States that he has been seen for both prior, but cannot remember what they told him to do for these.  He reports dizziness with changing position.  Describes it as a loss of balance, without recent fall.  Denies any headache, room spinning sensation.  He is has been drinking water, but does not believe that it is enough.  He is currently homeless at this time, staying at a shelter.   Was seen at different ER 2 days ago for depression.  Was discharged in stable condition.  Says he has previously been on Lexapro, but has not been on medication for over a year.   Dizziness      Home Medications Prior to Admission medications   Medication Sig Start Date End Date Taking? Authorizing Provider  lisinopril (ZESTRIL) 20 MG tablet Take 1 tablet (20 mg total) by mouth daily. 10/10/22  Yes Salisa Broz T, PA-C  albuterol (VENTOLIN HFA) 108 (90 Base) MCG/ACT inhaler Inhale 2 puffs into the lungs every 6 (six) hours as needed for wheezing or shortness of breath. Patient not taking: Reported on 09/16/2022 06/25/22   Damita Lack, MD  buPROPion (WELLBUTRIN) 100 MG tablet Take 100 mg by mouth daily.    [provider]  cholecalciferol (CHOLECALCIFEROL) 25 MCG tablet Take 1 tablet (1,000 Units total) by mouth daily. Patient not taking: Reported on 09/16/2022 06/25/22   Damita Lack, MD  gabapentin (NEURONTIN) 100 MG capsule Take 100 mg by mouth 3 (three) times daily. 12/20/21   [provider]  metFORMIN (GLUCOPHAGE) 500 MG tablet Take 1 tablet (500 mg total) by mouth 2 (two) times daily with a meal. 10/10/22 11/09/22   Deshawn Skelley T, PA-C  sertraline (ZOLOFT) 50 MG tablet Take 1 tablet (50 mg total) by mouth daily. 10/09/22 11/08/22  Delfin Gant, NP  traZODone (DESYREL) 50 MG tablet Take 1 tablet (50 mg total) by mouth at bedtime as needed for up to 15 days for sleep. 10/09/22 10/24/22  Delfin Gant, NP  vitamin B-12 100 MCG tablet Take 1 tablet (100 mcg total) by mouth daily. Patient not taking: Reported on 09/16/2022 06/25/22   Damita Lack, MD      Allergies    Morphine and Other    Review of Systems   Review of Systems  Neurological:  Positive for dizziness.  Psychiatric/Behavioral:  Negative for suicidal ideas.        Depression  All other systems reviewed and are negative.   Physical Exam Updated Vital Signs BP (!) 169/106 (BP Location: Right Arm)   Pulse 91   Temp 98.9 F (37.2 C) (Oral)   Resp 20   Ht 6\' 4"  (1.93 m)   Wt 111.1 kg   SpO2 100%   BMI 29.82 kg/m  Physical Exam Vitals and nursing note reviewed.  Constitutional:      Appearance: Normal appearance.  HENT:     Head: Normocephalic and atraumatic.  Eyes:     Conjunctiva/sclera: Conjunctivae normal.  Cardiovascular:     Rate and Rhythm: Normal rate and regular rhythm.  Pulmonary:  Effort: Pulmonary effort is normal. No respiratory distress.     Breath sounds: Normal breath sounds.  Abdominal:     General: There is no distension.     Palpations: Abdomen is soft.     Tenderness: There is no abdominal tenderness.  Skin:    General: Skin is warm and dry.  Neurological:     General: No focal deficit present.     Mental Status: He is alert.     Comments: Neuro: Speech is clear, able to follow commands. CN III-XII intact grossly intact. PERRLA. EOMI. Sensation intact throughout. Str 5/5 all extremities.  Psychiatric:        Attention and Perception: Attention and perception normal. He does not perceive auditory or visual hallucinations.        Mood and Affect: Mood and affect normal.        Speech:  Speech normal.        Behavior: Behavior normal.        Thought Content: Thought content normal. Thought content does not include homicidal or suicidal ideation. Thought content does not include homicidal or suicidal plan.     ED Results / Procedures / Treatments   Labs (all labs ordered are listed, but only abnormal results are displayed) Labs Reviewed  BASIC METABOLIC PANEL - Abnormal; Notable for the following components:      Result Value   Glucose, Bld 122 (*)    Creatinine, Ser 1.27 (*)    All other components within normal limits  CBC - Abnormal; Notable for the following components:   WBC 14.0 (*)    RBC 5.87 (*)    Hemoglobin 17.1 (*)    HCT 54.5 (*)    All other components within normal limits  URINALYSIS, ROUTINE W REFLEX MICROSCOPIC - Abnormal; Notable for the following components:   Color, Urine AMBER (*)    APPearance HAZY (*)    Protein, ur 30 (*)    All other components within normal limits  RESP PANEL BY RT-PCR (RSV, FLU A&B, COVID)  RVPGX2    EKG EKG Interpretation  Date/Time:  Tuesday October 10 2022 16:26:20 EST Ventricular Rate:  96 PR Interval:  131 QRS Duration: 80 QT Interval:  341 QTC Calculation: 431 R Axis:   49 Text Interpretation: Sinus rhythm Probable left atrial enlargement Consider anterior infarct Confirmed by Godfrey Pick ([id number]) on 10/10/2022 6:26:04 PM  Radiology CT Head Wo Contrast  Result Date: 10/10/2022 CLINICAL DATA:  Mental status change, unknown cause EXAM: CT HEAD WITHOUT CONTRAST TECHNIQUE: Contiguous axial images were obtained from the base of the skull through the vertex without intravenous contrast. RADIATION DOSE REDUCTION: This exam was performed according to the departmental dose-optimization program which includes automated exposure control, adjustment of the mA and/or kV according to patient size and/or use of iterative reconstruction technique. COMPARISON:  CT head 09/11/2022 FINDINGS: Brain: Similar-appearing right frontal  encephalomalacia. No evidence of large-territorial acute infarction. No parenchymal hemorrhage. No mass lesion. 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 No acute extra-axial collection. No mass effect or midline shift. No hydrocephalus. Basilar cisterns are patent. Vascular: No hyperdense vessel. 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 Skull: No acute fracture or focal lesion. Sinuses/Orbits: Bilateral maxillary mild mucosal thickening. Otherwise paranasal sinuses and mastoid air cells are clear. The orbits are unremarkable. Other: None. IMPRESSION: 1. No acute intracranial abnormality. 2. 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 No definite acute component. No midline shift. Electronically Signed   By: Thomasena Edis  Mckinley Jewel M.D.   On: 10/10/2022 17:21   DG Chest 1 View  Result Date: 10/10/2022 CLINICAL DATA:  Shortness of breath, dizziness. EXAM: CHEST  1 VIEW COMPARISON:  June 23, 2022. FINDINGS: The heart size and mediastinal contours are within normal limits. Both lungs are clear. The visualized skeletal structures are unremarkable. IMPRESSION: No active disease. Electronically Signed   By: Marijo Conception M.D.   On: 10/10/2022 09:55   DG Shoulder Right  Result Date: 10/09/2022 CLINICAL DATA:  72 year old male with history of right shoulder burning pain. EXAM: RIGHT SHOULDER - 2+ VIEW COMPARISON:  No priors. FINDINGS: Three views of the right shoulder demonstrate no acute displaced fracture, subluxation or dislocation. Degenerative changes of osteoarthritis are noted in the glenohumeral and acromioclavicular joints. Humeral head is high-riding, suggesting underlying rotator cuff disease. IMPRESSION: 1. No acute radiographic abnormality of the right shoulder. 2. 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 Electronically Signed   By: Vinnie Langton M.D.   On: 10/09/2022 08:30    Procedures Procedures    Medications Ordered in ED Medications  sodium chloride 0.9 % bolus 1,000 mL (0 mLs Intravenous Stopped 10/10/22 1734)  amLODipine (NORVASC) tablet 5 mg (5 mg Oral Given 10/10/22 1738)    ED Course/ Medical Decision Making/ A&P                           Medical Decision Making Amount and/or Complexity of Data Reviewed Radiology: ordered.   This patient is a 72 y.o. male  who presents to the ED for concern of dizziness and depression. No SI, HI, AVH.  Differential diagnoses prior to evaluation: The emergent differential diagnosis includes, but is not limited to,  BPPV, vestibular migraine, head trauma, AVM, intracranial tumor, multiple sclerosis, drug-related, CVA, vasovagal syncope, orthostatic hypotension, sepsis, hypoglycemia, electrolyte disturbance, anemia, anxiety/panic attack This is not an exhaustive differential.   Past Medical History / Co-morbidities: Dizziness, subdural hematoma, homelessness, depression, hypertension  Additional history: Chart reviewed. Pertinent results include: Seen at [facility] ER on 12/31 for depression and shoulder pain. Unremarkable labs and imaging. Was evaluated by psychiatry and had prescriptions of zoloft and trazodone sent to the pharmacy.   Most recently evaluated for dizziness on 12/4 by outside hospital. Had unremarkable labs. CT of head significant for possible acute on chronic subdural hematoma. Neurosurgery was consulted and did not believe it required intervention. They were to follow up with patient at scheduled appointment on 12/14, which patient did not attend.   Physical Exam: Physical exam performed. The pertinent findings include: Hypertensive to 190/102, improved after single dose of amlodipine. Neurologic exam normal as above.   Lab Tests/Imaging studies: I personally interpreted labs/imaging and the pertinent results include:  CBC with hemoglobin 17.1, likely  hemoconcentrated. CMP with mild creatinine elevation, otherwise unremarkable. Urinalysis negative for hematuria or infection. Respiratory panel negative for COVID, flu, rsv.  Chest x-ray without acute abnormalities. CT head with stable subacute subdural hematoma. I agree with the radiologist interpretation.  Cardiac monitoring: EKG obtained and interpreted by my attending physician which shows: sinus rhythm   Medications: I ordered medication including IV fluids and amlodipine.  I have reviewed the patients home medicines and have made adjustments as needed.   Disposition: After consideration of the diagnostic results and the patients response to treatment, I feel that emergency department workup does not suggest an emergent condition requiring admission or  immediate intervention beyond what has been performed at this time. The plan is: discharge with homelessness resources, refills of metformin and lisinopril. Suspect dizziness likely related to elevated blood pressure, normal orthostatics. Evaluated by behavioral health yesterday and given prescriptions, explained to the patient where to pick them up. Consult placed to [facility] to assist patient with housing and medications. The patient is safe for discharge and has been instructed to return immediately for worsening symptoms, change in symptoms or any other concerns.  Final Clinical Impression(s) / ED Diagnoses Final diagnoses:  Dizziness  Depression, unspecified depression type    Rx / DC Orders ED Discharge Orders          Ordered    lisinopril (ZESTRIL) 20 MG tablet  Daily        10/10/22 1845    metFORMIN (GLUCOPHAGE) 500 MG tablet  2 times daily with meals        10/10/22 1845           Portions of this report may have been transcribed using voice recognition software. Every effort was made to ensure accuracy; however, inadvertent computerized transcription errors may be present.    Estill Cotta 10/10/22 1853     Godfrey Pick, MD 10/10/22 2300

## 2022-10-10 NOTE — ED Triage Notes (Signed)
Patient BIB [facility] from the warming station with compliant of dizziness that started a few days ago. Patient denies falls. Patient is alert, oriented, and in no apparent distress at this time.   BP 142/92 96 HR 98% CBG 146

## 2022-10-10 NOTE — ED Notes (Signed)
Pt is asking for 2 lidocaine patches. Provider sent a message reference same

## 2022-10-10 NOTE — ED Notes (Signed)
Patient has urinated all over the floor.

## 2022-10-10 NOTE — Progress Notes (Signed)
TOC CSW consulted with pt about being un-housed.  Pt was provided with both green and blue book, and a list of shelters.  CSW discussed shelter resources with pt.  Pt has his own car, but was brought here from [facility] warming station by EMS.  Pt has been in the [place]/ area since June of 2023 per chart.  Salvatore Poe Tarpley-Carter, MSW, LCSW-A Pronouns:  She/Her/Hers [facility] Clinical Social Worker Direct Number:  [phone] [email or web address]

## 2022-10-11 ENCOUNTER — Emergency Department (HOSPITAL_COMMUNITY): Payer: Medicare Other

## 2022-10-11 ENCOUNTER — Emergency Department (HOSPITAL_COMMUNITY)
Admission: EM | Admit: 2022-10-11 | Discharge: 2022-10-11 | Disposition: A | Discharge status: Home / Self Care | Payer: Medicare Other | Attending: Emergency Medicine | Admitting: Emergency Medicine

## 2022-10-11 ENCOUNTER — Encounter (HOSPITAL_COMMUNITY): Payer: Self-pay

## 2022-10-11 ENCOUNTER — Other Ambulatory Visit: Payer: Self-pay

## 2022-10-11 ENCOUNTER — Encounter (HOSPITAL_COMMUNITY): Payer: Self-pay | Admitting: Emergency Medicine

## 2022-10-11 DIAGNOSIS — R42 Dizziness and giddiness: Secondary | ICD-10-CM | POA: Diagnosis present

## 2022-10-11 DIAGNOSIS — R059 Cough, unspecified: Secondary | ICD-10-CM | POA: Diagnosis not present

## 2022-10-11 DIAGNOSIS — R0981 Nasal congestion: Secondary | ICD-10-CM | POA: Diagnosis not present

## 2022-10-11 LAB — CBC WITH DIFFERENTIAL/PLATELET
Abs Immature Granulocytes: 0.02 10*3/uL (ref 0.00–0.07)
Basophils Absolute: 0 10*3/uL (ref 0.0–0.1)
Basophils Relative: 0 %
Eosinophils Absolute: 0 10*3/uL (ref 0.0–0.5)
Eosinophils Relative: 0 %
HCT: 49.6 % (ref 39.0–52.0)
Hemoglobin: 15.7 g/dL (ref 13.0–17.0)
Immature Granulocytes: 0 %
Lymphocytes Relative: 10 %
Lymphs Abs: 0.9 10*3/uL (ref 0.7–4.0)
MCH: 28.9 pg (ref 26.0–34.0)
MCHC: 31.7 g/dL (ref 30.0–36.0)
MCV: 91.3 fL (ref 80.0–100.0)
Monocytes Absolute: 1.1 10*3/uL — ABNORMAL HIGH (ref 0.1–1.0)
Monocytes Relative: 13 %
Neutro Abs: 6.7 10*3/uL (ref 1.7–7.7)
Neutrophils Relative %: 77 %
Platelets: 299 10*3/uL (ref 150–400)
RBC: 5.43 MIL/uL (ref 4.22–5.81)
RDW: 13.4 % (ref 11.5–15.5)
WBC: 8.8 10*3/uL (ref 4.0–10.5)
nRBC: 0 % (ref 0.0–0.2)

## 2022-10-11 LAB — RESP PANEL BY RT-PCR (RSV, FLU A&B, COVID)  RVPGX2
Influenza A by PCR: NEGATIVE
Influenza B by PCR: NEGATIVE
Resp Syncytial Virus by PCR: NEGATIVE
SARS Coronavirus 2 by RT PCR: NEGATIVE

## 2022-10-11 LAB — BRAIN NATRIURETIC PEPTIDE: B Natriuretic Peptide: 72.7 pg/mL (ref 0.0–100.0)

## 2022-10-11 LAB — BASIC METABOLIC PANEL
Anion gap: 11 (ref 5–15)
BUN: 20 mg/dL (ref 8–23)
CO2: 20 mmol/L — ABNORMAL LOW (ref 22–32)
Calcium: 8.3 mg/dL — ABNORMAL LOW (ref 8.9–10.3)
Chloride: 102 mmol/L (ref 98–111)
Creatinine, Ser: 1.16 mg/dL (ref 0.61–1.24)
GFR, Estimated: >60 mL/min (ref >60)
Glucose, Bld: 119 mg/dL — ABNORMAL HIGH (ref 70–99)
Potassium: 3.9 mmol/L (ref 3.5–5.1)
Sodium: 133 mmol/L — ABNORMAL LOW (ref 135–145)

## 2022-10-11 LAB — CBG MONITORING, ED: Glucose-Capillary: 107 mg/dL — ABNORMAL HIGH (ref 70–99)

## 2022-10-11 MED ORDER — IOHEXOL 350 MG/ML SOLN
75.0000 mL | Freq: Once | INTRAVENOUS | Status: AC | PRN
  Administered 2022-10-11: 75 mL via INTRAVENOUS

## 2022-10-11 NOTE — ED Provider Triage Note (Signed)
Emergency Medicine Provider Triage Evaluation Note  Purcell Jungbluth. , a 72 y.o. male  was evaluated in triage.  Pt complains of dizziness, he also has some URI symptoms.  He feels more lightheaded.  Not really vertiginous.  No neurologic complaints..  Review of Systems  Positive: Dizziness, URI symptoms, cough, no vomiting Negative: No chest pain, no neurologic deficits  Physical Exam  BP (!) 154/101   Pulse 94   Temp 98.1 F (36.7 C)   Resp 18   SpO2 100%  Gen:   Awake, no distress    Resp:  Normal effort   MSK:   Moves extremities without difficulty   Other:  Oriented x 3, no neurologic deficits  Medical Decision Making  Medically screening exam initiated at 8:57 AM.  Appropriate orders placed.  [name]. was informed that the remainder of the evaluation will be completed by another provider, this initial triage assessment does not replace that evaluation, and the importance of remaining in the ED until their evaluation is complete.  Has been seen twice in the last 2 days for same symptoms.  His labs are nonconcerning.  He had a CTA of his chest which shows no evidence of PE.  There is a pulmonary nodule.  He was given a supply of his medicines by social work.  He says he is living in his car.  His head CT does show a subacute subdural hematoma.  Will repeat head CT but hold off on ordering more labs at this point   Malvin Johns, MD 10/11/22 [phone]

## 2022-10-11 NOTE — ED Notes (Addendum)
Pt left AMA Pt IV took out 

## 2022-10-11 NOTE — ED Provider Triage Note (Signed)
  Emergency Medicine Provider Triage Evaluation Note  MRN:  [id number]  Arrival date & time: 10/11/22    Medically screening exam initiated at 12:15 AM.   CC:   Syncopal Episode   HPI:  Erman Thum. is a 72 y.o. year-old male presents to the ED with chief complaint of dizziness. States that he passed out at the bus stop today and someone drove him to the hospital.  Reports associated SOB.  History provided by patient. ROS:  -As included in HPI PE:   Vitals:   10/11/22 0000  BP: (!) 147/95  Pulse: (!) 119  Resp: 18  Temp: 98.4 F (36.9 C)  SpO2: 99%    Non-toxic appearing No respiratory distress  MDM:  Based on signs and symptoms, PE is highest on my differential, followed by ACS. I've ordered labs and imaging in triage to expedite lab/diagnostic workup.  Patient was informed that the remainder of the evaluation will be completed by another provider, this initial triage assessment does not replace that evaluation, and the importance of remaining in the ED until their evaluation is complete.    Montine Circle, PA-C 10/11/22 [phone]

## 2022-10-11 NOTE — ED Triage Notes (Signed)
Pt reports dizziness and congestion.

## 2022-10-11 NOTE — Discharge Planning (Signed)
RNCM met with pt in waiting room.  Pt states he felt dizzy at the bus stop and returned for evaluation.  Pt was eating ED "happy meal" and having juice.  RNCM consulted regarding PCP establishment.  Pt missed appointment in [facility] 12/7 as pt is homeless and does not have reliable transportation.  RNCM obtained appointment with [facility] Internal Medicine (Dr Elliot Gurney) 10/24/22 at 3:45.  PCP appointment information placed on AVS.

## 2022-10-11 NOTE — ED Notes (Signed)
Cbg 107 

## 2022-10-11 NOTE — Progress Notes (Signed)
TOC CSW spoke with pts daughter, Toma Copier [phone].  [place] stated that pt came to [place], [place] from [place], [place] to live with her.  Unfortunately, [place] and her husband were not aware of pt moving here.  T[place]ania has 5 children, her mom, herself, and her husband living in the home currently.  Pt at times can become agitated by her children this is the reason he does not live with her.  So, from time to time [place] and her husband would provide pt with a hotel to stay, but he will get lost or forget that he is staying at the hotel.  [place] states that every time she attempts to get pt diagnosed with memory loss pt will not show up to appointment.  [place] states she has not had time to research the best way to help pt, but is open to assistance.  CSW advised [place] to contact the DSS closest to her which would be [facility] to assist with guardianship.  Or she could get pt a PCP to assist with getting pt diagnosed and placed at a [place].  Pt receives $2,200 in SSI a month.  [place] would like for pt to give her a call so they can discuss his next steps.  Reika Callanan Tarpley-Carter, MSW, LCSW-A Pronouns:  She/Her/Hers [facility] Clinical Social Worker Direct Number:  [phone] [email or web address]

## 2022-10-11 NOTE — ED Notes (Signed)
Pt verbalized understanding of discharge paperwork that included a PCP appt we have scheduled. Pt had multiple previous discharged paperwork and he stated he did not know where to go. Rn help him look through resource guide and pt ambulatory to waiting room.

## 2022-10-11 NOTE — Progress Notes (Signed)
Pt has a 3-day supply of meds to be discharged with that has been sent to the pharmacy.  Danyah Guastella Tarpley-Carter, MSW, LCSW-A Pronouns:  She/Her/Hers [facility] Clinical Social Worker Direct Number:  [phone] [email or web address]

## 2022-10-11 NOTE — ED Provider Notes (Signed)
[facility] EMERGENCY DEPARTMENT Provider Note   CSN: [id number] Arrival date & time: 10/11/22  0801     History  Chief Complaint  Patient presents with   Dizziness    Glenn Barnett. is a 72 y.o. male.  Patient is a 72 year old male who has a history of diabetes and reported brain aneurysm who presents with dizziness.  He reports dizziness has been going on for "a while".  He also has some nasal congestion and coughing.  When he reports dizziness, he says he feels more lightheaded.  No vertiginous type symptoms.  Its mostly when he stands up.  No numbness or weakness to his extremities.  No speech deficits or vision changes.  He has been seen here twice before in the last 2 days.  No recent falls or injuries.  He reports that he is currently living in his car.       Home Medications Prior to Admission medications   Medication Sig Start Date End Date Taking? Authorizing Provider  albuterol (VENTOLIN HFA) 108 (90 Base) MCG/ACT inhaler Inhale 2 puffs into the lungs every 6 (six) hours as needed for wheezing or shortness of breath. Patient not taking: Reported on 09/16/2022 06/25/22   Damita Lack, MD  buPROPion (WELLBUTRIN) 100 MG tablet Take 100 mg by mouth daily.    [provider]  cholecalciferol (CHOLECALCIFEROL) 25 MCG tablet Take 1 tablet (1,000 Units total) by mouth daily. Patient not taking: Reported on 09/16/2022 06/25/22   Damita Lack, MD  gabapentin (NEURONTIN) 100 MG capsule Take 100 mg by mouth 3 (three) times daily. 12/20/21   [provider]  lisinopril (ZESTRIL) 20 MG tablet Take 1 tablet (20 mg total) by mouth daily. 10/10/22   Roemhildt, Lorin T, PA-C  metFORMIN (GLUCOPHAGE) 500 MG tablet Take 1 tablet (500 mg total) by mouth 2 (two) times daily with a meal. 10/10/22 11/09/22  Roemhildt, Lorin T, PA-C  sertraline (ZOLOFT) 50 MG tablet Take 1 tablet (50 mg total) by mouth daily. 10/10/22 11/09/22  Roemhildt, Lorin T, PA-C   traZODone (DESYREL) 50 MG tablet Take 1 tablet (50 mg total) by mouth at bedtime as needed for up to 15 days for sleep. 10/10/22 10/25/22  Roemhildt, Lorin T, PA-C  vitamin B-12 100 MCG tablet Take 1 tablet (100 mcg total) by mouth daily. Patient not taking: Reported on 09/16/2022 06/25/22   Damita Lack, MD      Allergies    Morphine and Other    Review of Systems   Review of Systems  Constitutional:  Positive for fatigue. Negative for chills, diaphoresis and fever.  HENT:  Positive for congestion and rhinorrhea. Negative for sneezing.   Eyes: Negative.   Respiratory:  Positive for cough. Negative for chest tightness and shortness of breath.   Cardiovascular:  Negative for chest pain and leg swelling.  Gastrointestinal:  Negative for abdominal pain, blood in stool, diarrhea, nausea and vomiting.  Genitourinary:  Negative for difficulty urinating, flank pain, frequency and hematuria.  Musculoskeletal:  Negative for arthralgias and back pain.  Skin:  Negative for rash.  Neurological:  Positive for dizziness and light-headedness. Negative for speech difficulty, weakness, numbness and headaches.    Physical Exam Updated Vital Signs BP (!) 154/101   Pulse 94   Temp 98.1 F (36.7 C)   Resp 18   SpO2 100%  Physical Exam Constitutional:      Appearance: He is well-developed.  HENT:     Head: Normocephalic  and atraumatic.  Eyes:     Pupils: Pupils are equal, round, and reactive to light.  Cardiovascular:     Rate and Rhythm: Normal rate and regular rhythm.     Heart sounds: Normal heart sounds.  Pulmonary:     Effort: Pulmonary effort is normal. No respiratory distress.     Breath sounds: Normal breath sounds. No wheezing or rales.  Chest:     Chest wall: No tenderness.  Abdominal:     General: Bowel sounds are normal.     Palpations: Abdomen is soft.     Tenderness: There is no abdominal tenderness. There is no guarding or rebound.  Musculoskeletal:        General:  Normal range of motion.     Cervical back: Normal range of motion and neck supple.  Lymphadenopathy:     Cervical: No cervical adenopathy.  Skin:    General: Skin is warm and dry.     Findings: No rash.  Neurological:     Mental Status: He is alert and oriented to person, place, and time.     Comments: Motor 5/5 all extremities Sensation grossly intact to LT all extremities Finger to Nose intact, no pronator drift CN II-XII grossly intact Gait normal      ED Results / Procedures / Treatments   Labs (all labs ordered are listed, but only abnormal results are displayed) Labs Reviewed - No data to display  EKG None  Radiology CT Head Wo Contrast  Result Date: 10/11/2022 CLINICAL DATA:  Subdural hematoma evaluation. EXAM: CT HEAD WITHOUT CONTRAST TECHNIQUE: Contiguous axial images were obtained from the base of the skull through the vertex without intravenous contrast. RADIATION DOSE REDUCTION: This exam was performed according to the departmental dose-optimization program which includes automated exposure control, adjustment of the mA and/or kV according to patient size and/or use of iterative reconstruction technique. 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 Redemonstrated encephalomalacia in the medial right frontal lobe, unchanged compared to 03/20/2021 CT brain. There is no significant change in size of the right parietal convexity subdural hematoma, measuring up to 7 mm, previously 8 mm. There is no midline shift. No hydrocephalus. No CT evidence of a new infarct. Vascular: No hyperdense vessel or unexpected calcification. Skull: Right pterional craniotomy. Sinuses/Orbits: Mild mucosal thickening bilateral maxillary, ethmoid, right frontal sinus. No mastoid or middle ear effusion. Bilateral orbits are normal in appearance. Other: None IMPRESSION: No significant change in size of the right parietal  convexity subdural hematoma, measuring up to 7 mm, previously 8 mm. No midline shift. Electronically Signed   By: Lorenza Cambridge M.D.   On: 10/11/2022 10:09   CT Angio Chest PE W and/or Wo Contrast  Result Date: 10/11/2022 CLINICAL DATA:  Pulmonary embolism (PE) suspected, high prob. Increasing shortness of breath EXAM: CT ANGIOGRAPHY CHEST WITH CONTRAST TECHNIQUE: Multidetector CT imaging of the chest was performed using the standard protocol during bolus administration of intravenous contrast. Multiplanar CT image reconstructions and MIPs were obtained to evaluate the vascular anatomy. RADIATION DOSE REDUCTION: This exam was performed according to the departmental dose-optimization program which includes automated exposure control, adjustment of the mA and/or kV according to patient size and/or use of iterative reconstruction technique. CONTRAST:  61mL OMNIPAQUE IOHEXOL 350 MG/ML SOLN COMPARISON:  CT angio chest 06/23/2022 FINDINGS: Cardiovascular: Satisfactory opacification of the pulmonary arteries to the segmental level. No evidence of pulmonary embolism. Limited evaluation of the subsegmental level  due to motion artifact. The main pulmonary artery is normal in caliber. Normal heart size. No significant pericardial effusion. The thoracic aorta is normal in caliber. No atherosclerotic plaque of the thoracic aorta. No coronary artery calcifications. Mediastinum/Nodes: Nonspecific total of persistent three borderline enlarged right hilar lymph nodes. No enlarged mediastinal, hilar, or axillary lymph nodes. Thyroid gland, trachea, and esophagus demonstrate no significant findings. Lungs/Pleura: No focal consolidation. Few scattered pulmonary micronodules. Interval development of a 1.3 cm ground-glass pulmonary nodule within left lower lobe (9:77). No Pulmonary mass. No pleural effusion. No pneumothorax. Upper Abdomen: Cholelithiasis.  No acute abnormality. Musculoskeletal: No chest wall abnormality. No suspicious  lytic or blastic osseous lesions. No acute displaced fracture. Review of the MIP images confirms the above findings. IMPRESSION: 1. No pulmonary embolus with limited evaluation of the subsegmental level due to motion artifact. 2. Interval development of a left lower lobe 1.3 cm ground-glass pulmonary nodule. Initial follow-up with CT at 6 months is recommended to confirm persistence. If persistent, repeat CT is recommended every 2 years until 5 years of stability has been established. This recommendation follows the consensus statement: Guidelines for Management of Incidental Pulmonary Nodules Detected on CT Images: From the Fleischner Society 2017; Radiology 2017; 284:228-243. 3. Few scattered pulmonary micronodules. 4. Nonspecific total of persistent three borderline enlarged right hilar lymph nodes. Recommend attention on follow-up. 5. Cholelithiasis. 6.  Aortic Atherosclerosis (ICD10-I70.0). Electronically Signed   By: Tish Frederickson M.D.   On: 10/11/2022 01:59   DG Chest 2 View  Result Date: 10/11/2022 CLINICAL DATA:  Shortness of breath, syncope. EXAM: CHEST - 2 VIEW COMPARISON:  10/10/2022. FINDINGS: The heart size and mediastinal contours are within normal limits. Mild peribronchial cuffing is noted bilaterally. No consolidation, effusion, or pneumothorax. No acute osseous abnormality. IMPRESSION: Peribronchial cuffing bilaterally, possible bronchitis. Electronically Signed   By: Thornell Sartorius M.D.   On: 10/11/2022 01:10   CT Head Wo Contrast  Result Date: 10/10/2022 CLINICAL DATA:  Mental status change, unknown cause EXAM: CT HEAD WITHOUT CONTRAST TECHNIQUE: Contiguous axial images were obtained from the base of the skull through the vertex without intravenous contrast. RADIATION DOSE REDUCTION: This exam was performed according to the departmental dose-optimization program which includes automated exposure control, adjustment of the mA and/or kV according to patient size and/or use of iterative  reconstruction technique. COMPARISON:  CT head 09/11/2022 FINDINGS: Brain: Similar-appearing right frontal encephalomalacia. No evidence of large-territorial acute infarction. No parenchymal hemorrhage. No mass lesion. 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 No acute extra-axial collection. No mass effect or midline shift. No hydrocephalus. Basilar cisterns are patent. Vascular: No hyperdense vessel. 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 Skull: No acute fracture or focal lesion. Sinuses/Orbits: Bilateral maxillary mild mucosal thickening. Otherwise paranasal sinuses and mastoid air cells are clear. The orbits are unremarkable. Other: None. IMPRESSION: 1. No acute intracranial abnormality. 2. 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 No definite acute component. No midline shift. Electronically Signed   By: Tish Frederickson M.D.   On: 10/10/2022 17:21   DG Chest 1 View  Result Date: 10/10/2022 CLINICAL DATA:  Shortness of breath, dizziness. EXAM: CHEST  1 VIEW COMPARISON:  June 23, 2022. FINDINGS: The heart size and mediastinal contours are within normal limits. Both lungs are clear. The visualized skeletal structures are unremarkable. IMPRESSION: No active disease. Electronically Signed   By: Lupita Raider M.D.   On: 10/10/2022 09:55    Procedures Procedures    Medications Ordered in  ED Medications - No data to display  ED Course/ Medical Decision Making/ A&P                           Medical Decision Making Amount and/or Complexity of Data Reviewed Radiology: ordered.   Patient presents with lightheadedness.  He does not have any focal neurologic deficits.  He is able to ambulate in the ED without difficulty.  I reviewed his chart and over the last 48 hours, he has had 2 sets of blood work.  This is been reviewed by me and is nonconcerning.  His COVID/flu test is negative.  He has had chest x-ray which does  not show any evidence of pneumonia.  He had a CT scan last night which does not show any evidence of PE.  He does have a pulmonary nodule which was relayed to him that needs outpatient follow-up.  He did have a head CT which showed acute versus chronic subdural hematoma.  This was repeated this morning and does not show any interval change.  He is not on anticoagulants.  I did speak with the social worker who has obtained him an appointment with her primary care doctor.  She previously gave him refills on his medications which she has with him.  She also spoke to the patient's daughter who advises that she is trying to get him into an assisted living type facility.  The social worker did advise the daughter of the upcoming appointment and hopefully the daughter will be able to meet the patient at this appointment.  The daughter has been having a hard time finding the patient since he has been homeless.  Patient was given the information about follow-up.  Return precautions were given.  He was also given food and drink here in the ED.  Final Clinical Impression(s) / ED Diagnoses Final diagnoses:  None    Rx / DC Orders ED Discharge Orders     None         Malvin Johns, MD 10/11/22 1036

## 2022-10-11 NOTE — ED Triage Notes (Signed)
Just discharged. [place] bus stop after discharge and reports a syncopal episode after getting dizzy and almost sitting down.   Denies LOC or injury.

## 2022-10-17 ENCOUNTER — Emergency Department (HOSPITAL_COMMUNITY)
Admission: EM | Admit: 2022-10-17 | Discharge: 2022-10-17 | Disposition: A | Discharge status: Home / Self Care | Payer: Medicare Other | Attending: Emergency Medicine | Admitting: Emergency Medicine

## 2022-10-17 ENCOUNTER — Encounter (HOSPITAL_COMMUNITY): Payer: Self-pay

## 2022-10-17 ENCOUNTER — Emergency Department (HOSPITAL_COMMUNITY): Payer: Medicare Other

## 2022-10-17 ENCOUNTER — Other Ambulatory Visit: Payer: Self-pay

## 2022-10-17 DIAGNOSIS — Z1152 Encounter for screening for COVID-19: Secondary | ICD-10-CM | POA: Insufficient documentation

## 2022-10-17 DIAGNOSIS — Z5900 Homelessness unspecified: Secondary | ICD-10-CM | POA: Diagnosis not present

## 2022-10-17 DIAGNOSIS — Z7984 Long term (current) use of oral hypoglycemic drugs: Secondary | ICD-10-CM | POA: Diagnosis not present

## 2022-10-17 DIAGNOSIS — R0602 Shortness of breath: Secondary | ICD-10-CM

## 2022-10-17 DIAGNOSIS — F32A Depression, unspecified: Secondary | ICD-10-CM | POA: Insufficient documentation

## 2022-10-17 DIAGNOSIS — E1165 Type 2 diabetes mellitus with hyperglycemia: Secondary | ICD-10-CM | POA: Diagnosis not present

## 2022-10-17 LAB — CBC WITH DIFFERENTIAL/PLATELET
Abs Immature Granulocytes: 0.01 10*3/uL (ref 0.00–0.07)
Basophils Absolute: 0 10*3/uL (ref 0.0–0.1)
Basophils Relative: 0 %
Eosinophils Absolute: 0.2 10*3/uL (ref 0.0–0.5)
Eosinophils Relative: 3 %
HCT: 46.7 % (ref 39.0–52.0)
Hemoglobin: 15.1 g/dL (ref 13.0–17.0)
Immature Granulocytes: 0 %
Lymphocytes Relative: 30 %
Lymphs Abs: 1.7 10*3/uL (ref 0.7–4.0)
MCH: 29.2 pg (ref 26.0–34.0)
MCHC: 32.3 g/dL (ref 30.0–36.0)
MCV: 90.2 fL (ref 80.0–100.0)
Monocytes Absolute: 0.6 10*3/uL (ref 0.1–1.0)
Monocytes Relative: 10 %
Neutro Abs: 3.3 10*3/uL (ref 1.7–7.7)
Neutrophils Relative %: 57 %
Platelets: 289 10*3/uL (ref 150–400)
RBC: 5.18 MIL/uL (ref 4.22–5.81)
RDW: 13.2 % (ref 11.5–15.5)
WBC: 5.8 10*3/uL (ref 4.0–10.5)
nRBC: 0 % (ref 0.0–0.2)

## 2022-10-17 LAB — BASIC METABOLIC PANEL
Anion gap: 9 (ref 5–15)
BUN: 18 mg/dL (ref 8–23)
CO2: 23 mmol/L (ref 22–32)
Calcium: 8.4 mg/dL — ABNORMAL LOW (ref 8.9–10.3)
Chloride: 106 mmol/L (ref 98–111)
Creatinine, Ser: 1.12 mg/dL (ref 0.61–1.24)
GFR, Estimated: >60 mL/min (ref >60)
Glucose, Bld: 133 mg/dL — ABNORMAL HIGH (ref 70–99)
Potassium: 3.5 mmol/L (ref 3.5–5.1)
Sodium: 138 mmol/L (ref 135–145)

## 2022-10-17 LAB — BRAIN NATRIURETIC PEPTIDE: B Natriuretic Peptide: 23.7 pg/mL (ref 0.0–100.0)

## 2022-10-17 LAB — TROPONIN I (HIGH SENSITIVITY): Troponin I (High Sensitivity): 7 ng/L (ref <18)

## 2022-10-17 LAB — RESP PANEL BY RT-PCR (RSV, FLU A&B, COVID)  RVPGX2
Influenza A by PCR: NEGATIVE
Influenza B by PCR: NEGATIVE
Resp Syncytial Virus by PCR: NEGATIVE
SARS Coronavirus 2 by RT PCR: NEGATIVE

## 2022-10-17 NOTE — ED Provider Notes (Signed)
[facility]-EMERGENCY DEPT Provider Note   CSN: [id number] Arrival date & time: 10/17/22  1747     History  Chief Complaint  Patient presents with   Suicidal    Glenn Barnett. is a 72 y.o. male with a past medical history significant for persistent dizziness, depression, type 2 diabetes, and history of brain aneurysm who presents to the ED due to worsening shortness of breath that started earlier today.  Denies associated chest pain.  Endorses bilateral lower extremity edema.  No history of blood clots, recent surgeries or recent long immobilizations, or hormonal treatments.  Patient also admits to persistent depression.  Denies SI and HI.  No hallucinations.  Not currently on any medications for depression.  Patient states he does not care if he lives, but no active thoughts of suicide. Patient currently living in car.  Seen numerous times in the ED for different complaints.  History obtained from patient and past medical records. No interpreter used during encounter.       Home Medications Prior to Admission medications   Medication Sig Start Date End Date Taking? Authorizing Provider  albuterol (VENTOLIN HFA) 108 (90 Base) MCG/ACT inhaler Inhale 2 puffs into the lungs every 6 (six) hours as needed for wheezing or shortness of breath. Patient not taking: Reported on 09/16/2022 06/25/22   Dimple Nanas, MD  buPROPion (WELLBUTRIN) 100 MG tablet Take 100 mg by mouth daily.    [provider]  cholecalciferol (CHOLECALCIFEROL) 25 MCG tablet Take 1 tablet (1,000 Units total) by mouth daily. Patient not taking: Reported on 09/16/2022 06/25/22   Dimple Nanas, MD  gabapentin (NEURONTIN) 100 MG capsule Take 100 mg by mouth 3 (three) times daily. 12/20/21   [provider]  lisinopril (ZESTRIL) 20 MG tablet Take 1 tablet (20 mg total) by mouth daily. 10/10/22   Roemhildt, Lorin T, PA-C  metFORMIN (GLUCOPHAGE) 500 MG tablet Take 1 tablet (500 mg  total) by mouth 2 (two) times daily with a meal. 10/10/22 11/09/22  Roemhildt, Lorin T, PA-C  sertraline (ZOLOFT) 50 MG tablet Take 1 tablet (50 mg total) by mouth daily. 10/10/22 11/09/22  Roemhildt, Lorin T, PA-C  traZODone (DESYREL) 50 MG tablet Take 1 tablet (50 mg total) by mouth at bedtime as needed for up to 15 days for sleep. 10/10/22 10/25/22  Roemhildt, Lorin T, PA-C  vitamin B-12 100 MCG tablet Take 1 tablet (100 mcg total) by mouth daily. Patient not taking: Reported on 09/16/2022 06/25/22   Dimple Nanas, MD      Allergies    Morphine and Other    Review of Systems   Review of Systems  Constitutional:  Negative for chills and fever.  Respiratory:  Positive for shortness of breath. Negative for cough.   Cardiovascular:  Negative for chest pain.  Gastrointestinal:  Negative for abdominal pain.  Psychiatric/Behavioral:  Negative for suicidal ideas.     Physical Exam Updated Vital Signs BP (!) 175/91 (BP Location: Left Arm)   Pulse 91   Temp 99.3 F (37.4 C) (Oral)   Resp 19   Ht 6\' 4"  (1.93 m)   Wt 117.9 kg   SpO2 97%   BMI 31.65 kg/m  Physical Exam Vitals and nursing note reviewed.  Constitutional:      General: He is not in acute distress.    Appearance: He is not ill-appearing.  HENT:     Head: Normocephalic.  Eyes:     Pupils: Pupils are equal, round,  and reactive to light.  Cardiovascular:     Rate and Rhythm: Normal rate and regular rhythm.     Pulses: Normal pulses.     Heart sounds: Normal heart sounds. No murmur heard.    No friction rub. No gallop.  Pulmonary:     Effort: Pulmonary effort is normal.     Breath sounds: Normal breath sounds.  Abdominal:     General: Abdomen is flat. There is no distension.     Palpations: Abdomen is soft.     Tenderness: There is no abdominal tenderness. There is no guarding or rebound.  Musculoskeletal:        General: Normal range of motion.     Cervical back: Neck supple.     Comments: 1+ pitting edema bilaterally.   Skin:    General: Skin is warm and dry.  Neurological:     General: No focal deficit present.     Mental Status: He is alert.  Psychiatric:        Mood and Affect: Mood normal.        Behavior: Behavior normal.     ED Results / Procedures / Treatments   Labs (all labs ordered are listed, but only abnormal results are displayed) Labs Reviewed  BASIC METABOLIC PANEL - Abnormal; Notable for the following components:      Result Value   Glucose, Bld 133 (*)    Calcium 8.4 (*)    All other components within normal limits  RESP PANEL BY RT-PCR (RSV, FLU A&B, COVID)  [id number]  CBC WITH DIFFERENTIAL/PLATELET  BRAIN NATRIURETIC PEPTIDE  TROPONIN I (HIGH SENSITIVITY)    EKG None  Radiology DG Chest 1 View  Result Date: 10/17/2022 CLINICAL DATA:  SOB EXAM: CHEST  1 VIEW COMPARISON:  10/11/2022 FINDINGS: Cardiac silhouette is unremarkable. No pneumothorax or pleural effusion. The lungs are clear. The visualized skeletal structures are unremarkable. IMPRESSION: No acute cardiopulmonary process. Electronically Signed   By: Layla Maw M.D.   On: 10/17/2022 18:32    Procedures Procedures    Medications Ordered in ED Medications - No data to display  ED Course/ Medical Decision Making/ A&P                           Medical Decision Making Amount and/or Complexity of Data Reviewed Labs: ordered. Decision-making details documented in ED Course. Radiology: ordered and independent interpretation performed. Decision-making details documented in ED Course. ECG/medicine tests: ordered and independent interpretation performed. Decision-making details documented in ED Course.   This patient presents to the ED for concern of SOB, this involves an extensive number of treatment options, and is a complaint that carries with it a high risk of complications and morbidity.  The differential diagnosis includes PNA, PE, ACS, etc  72 year old male presents to the ED due to shortness of breath  that started earlier today.  He also endorses acute on chronic lower extremity edema.  Patient also admits to some depression.  Denies SI and HI.  No hallucinations.  Currently homeless living in car.  Seen numerous times in the ED for different complaints.  Upon arrival, patient afebrile, not tachycardic or hypoxic.  Patient in no acute distress.  Physical exam significant for 1+ pitting edema bilaterally.  Lungs clear to auscultation bilaterally.  Chest x-ray to rule out evidence of pneumonia.  Routine labs ordered.  BNP to rule out CHF.  Respiratory panel ordered.  CBC unremarkable.  No leukocytosis.  Normal hemoglobin.  BNP normal.  Doubt CHF.  COVID/influenza negative.  BMP significant for mild hyperglycemia 133.  No anion gap.  Doubt DKA.  Normal renal function.  No major electrolyte derangements.  Troponin normal.  EKG demonstrates normal sinus rhythm.  No signs of acute ischemia.  Low suspicion for ACS.  Patient has maintained O2 saturation above 95% during his entire ED stay.  No evidence of respiratory distress.  Patient given food.  Patient evaluated by TTS on 1/1 and did not meet inpatient criteria. No evidence of psychosis. Denies SI/HI.  Patient does not meet criteria for TTS evaluation.  Suspect underlying homelessness driving numerous ED stays unfortunately. Patient discharged with shelter and behavioral health resources. Patient stable for discharge. Strict ED precautions discussed with patient. Patient states understanding and agrees to plan. Patient discharged home in no acute distress and stable vitals  Discussed with Dr. Maryan Rued who agrees with assessment and plan.   No PCP       Final Clinical Impression(s) / ED Diagnoses Final diagnoses:  Shortness of breath  Depression, unspecified depression type  Homelessness    Rx / DC Orders ED Discharge Orders     None         Karie Kirks 10/17/22 2038    Blanchie Dessert, MD 10/17/22 [phone]

## 2022-10-17 NOTE — ED Triage Notes (Signed)
Patient said he had to stop himself from walking in front of traffic. Has been feeling depressed and suicidal for a week. "I'm sick and tired of living." No hallucinations. No homicidal ideations.

## 2022-10-17 NOTE — Discharge Instructions (Signed)
It was a pleasure taking care of you today.  As discussed, all of your labs are reassuring.  I have included shelters in the community.  I have also included behavioral health resources.  Return to the ER for new or worsening symptoms.

## 2022-10-18 ENCOUNTER — Other Ambulatory Visit: Payer: Self-pay

## 2022-10-18 ENCOUNTER — Emergency Department (HOSPITAL_COMMUNITY)
Admission: EM | Admit: 2022-10-18 | Discharge: 2022-10-18 | Disposition: A | Discharge status: Home / Self Care | Payer: Medicare Other | Attending: Emergency Medicine | Admitting: Emergency Medicine

## 2022-10-18 ENCOUNTER — Encounter (HOSPITAL_COMMUNITY): Payer: Self-pay | Admitting: Emergency Medicine

## 2022-10-18 DIAGNOSIS — F32A Depression, unspecified: Secondary | ICD-10-CM | POA: Insufficient documentation

## 2022-10-18 DIAGNOSIS — Z7984 Long term (current) use of oral hypoglycemic drugs: Secondary | ICD-10-CM | POA: Diagnosis not present

## 2022-10-18 DIAGNOSIS — E119 Type 2 diabetes mellitus without complications: Secondary | ICD-10-CM | POA: Diagnosis not present

## 2022-10-18 DIAGNOSIS — R45851 Suicidal ideations: Secondary | ICD-10-CM | POA: Diagnosis present

## 2022-10-18 DIAGNOSIS — Z5900 Homelessness unspecified: Secondary | ICD-10-CM | POA: Diagnosis not present

## 2022-10-18 NOTE — Discharge Instructions (Addendum)
YOUR DAUGHTER'S ADDRESS  [address] [place] [address]  YOUR DAUGHTERS PHONE NUMBER [phone]

## 2022-10-18 NOTE — ED Notes (Signed)
Pt dressed in scrubs and belongings placed in locker 1.

## 2022-10-18 NOTE — ED Provider Notes (Signed)
[name]   CSN: [id number] Arrival date & time: 10/18/22  0156     History  Chief Complaint  Patient presents with   Suicidal    Glenn Barnett. is a 72 y.o. male.  HPI Patient presents for depressive symptoms.  Medical history includes DM, homelessness, depression.  This is a 6th ED visit over the past week and a half.  He was seen in [facility] yesterday for shortness of breath.  At that time, he denied SI/HI.  Patient is currently living in his car.  He states that between retirement and [place], he does have a approximately $1900 of income per month.  He knows that he can afford a place but states that he has a hard time making the right decisions.  He denies any alcohol use.  He occasionally smokes marijuana but does not participate in any other illicit drug use.  He has children and grandchildren that he is in contact with.  He does not have any desire to live with them.  He has tried homeless shelters and has thought about living in a group home but, for the time being, prefers to be on his own.  He states that he is not sure why he was brought here.  He states that this is a common thing for him to have foggy memories at times.  He currently denies any physical symptoms.  He currently denies any thoughts of harming himself or others.    Home Medications Prior to Admission medications   Medication Sig Start Date End Date Taking? Authorizing Provider  albuterol (VENTOLIN HFA) 108 (90 Base) MCG/ACT inhaler Inhale 2 puffs into the lungs every 6 (six) hours as needed for wheezing or shortness of breath. Patient not taking: Reported on 09/16/2022 06/25/22   Damita Lack, MD  buPROPion (WELLBUTRIN) 100 MG tablet Take 100 mg by mouth daily.    [provider]  cholecalciferol (CHOLECALCIFEROL) 25 MCG tablet Take 1 tablet (1,000 Units total) by mouth daily. Patient not taking: Reported on 09/16/2022 06/25/22    Damita Lack, MD  gabapentin (NEURONTIN) 100 MG capsule Take 100 mg by mouth 3 (three) times daily. 12/20/21   [provider]  lisinopril (ZESTRIL) 20 MG tablet Take 1 tablet (20 mg total) by mouth daily. 10/10/22   Roemhildt, Lorin T, PA-C  metFORMIN (GLUCOPHAGE) 500 MG tablet Take 1 tablet (500 mg total) by mouth 2 (two) times daily with a meal. 10/10/22 11/09/22  Roemhildt, Lorin T, PA-C  sertraline (ZOLOFT) 50 MG tablet Take 1 tablet (50 mg total) by mouth daily. 10/10/22 11/09/22  Roemhildt, Lorin T, PA-C  traZODone (DESYREL) 50 MG tablet Take 1 tablet (50 mg total) by mouth at bedtime as needed for up to 15 days for sleep. 10/10/22 10/25/22  Roemhildt, Lorin T, PA-C  vitamin B-12 100 MCG tablet Take 1 tablet (100 mcg total) by mouth daily. Patient not taking: Reported on 09/16/2022 06/25/22   Damita Lack, MD      Allergies    Morphine and Other    Review of Systems   Review of Systems  Psychiatric/Behavioral:  Positive for dysphoric mood.   All other systems reviewed and are negative.   Physical Exam Updated Vital Signs BP (!) 189/100   Pulse 69   Temp 98.1 F (36.7 C)   Resp 17   Ht 6\' 4"  (1.93 m)   Wt 115.7 kg   SpO2 98%  BMI 31.04 kg/m  Physical Exam Vitals and nursing note reviewed.  Constitutional:      General: He is not in acute distress.    Appearance: Normal appearance. He is well-developed. He is not ill-appearing, toxic-appearing or diaphoretic.  HENT:     Head: Normocephalic and atraumatic.     Right Ear: External ear normal.     Left Ear: External ear normal.     Nose: Nose normal.     Mouth/Throat:     Mouth: Mucous membranes are moist.  Eyes:     Extraocular Movements: Extraocular movements intact.     Conjunctiva/sclera: Conjunctivae normal.  Cardiovascular:     Rate and Rhythm: Normal rate and regular rhythm.  Pulmonary:     Effort: Pulmonary effort is normal. No respiratory distress.  Abdominal:     General: There is no distension.      Palpations: Abdomen is soft.  Musculoskeletal:        General: No swelling. Normal range of motion.     Cervical back: Normal range of motion and neck supple.     Right lower leg: No edema.     Left lower leg: No edema.  Skin:    General: Skin is warm and dry.     Capillary Refill: Capillary refill takes less than 2 seconds.     Coloration: Skin is not jaundiced or pale.  Neurological:     General: No focal deficit present.     Mental Status: He is alert and oriented to person, place, and time.     Cranial Nerves: No cranial nerve deficit.     Sensory: No sensory deficit.     Motor: No weakness.     Coordination: Coordination normal.  Psychiatric:        Attention and Perception: Attention and perception normal.        Mood and Affect: Affect normal. Mood is depressed.        Speech: Speech normal. Speech is not delayed, slurred or tangential.        Behavior: Behavior normal. Behavior is not slowed or withdrawn. Behavior is cooperative.        Thought Content: Thought content normal. Thought content does not include homicidal or suicidal ideation.        Judgment: Judgment normal.     ED Results / Procedures / Treatments   Labs (all labs ordered are listed, but only abnormal results are displayed) Labs Reviewed - No data to display   EKG EKG Interpretation  Date/Time:  Wednesday October 18 2022 02:04:52 EST Ventricular Rate:  75 PR Interval:  138 QRS Duration: 82 QT Interval:  388 QTC Calculation: 433 R Axis:   12 Text Interpretation: Normal sinus rhythm Minimal voltage criteria for LVH, may be normal variant ( R in aVL ) Confirmed by Godfrey Pick [phone]) on 10/18/2022 7:42:28 AM  Radiology DG Chest 1 View  Result Date: 10/17/2022 CLINICAL DATA:  SOB EXAM: CHEST  1 VIEW COMPARISON:  10/11/2022 FINDINGS: Cardiac silhouette is unremarkable. No pneumothorax or pleural effusion. The lungs are clear. The visualized skeletal structures are unremarkable. IMPRESSION: No acute  cardiopulmonary process. Electronically Signed   By: Sammie Bench M.D.   On: 10/17/2022 18:32    Procedures Procedures    Medications Ordered in ED Medications - No data to display  ED Course/ Medical Decision Making/ A&P  Medical Decision Making  This patient presents to the ED for concern of depression, this involves an extensive number of treatment options, and is a complaint that carries with it a high risk of complications and morbidity.  The differential diagnosis includes depression, psychosocial stressors   Co morbidities that complicate the patient evaluation  DM, homelessness, depression   Additional history obtained:  Additional history obtained from N/A External records from outside source obtained and reviewed including EMR  Cardiac Monitoring: / EKG:  The patient was maintained on a cardiac monitor.  I personally viewed and interpreted the cardiac monitored which showed an underlying rhythm of: Sinus rhythm   Problem List / ED Course / Critical interventions / Medication management  Patient presents to the ED with initial complaint of suicidal ideation.  Per triage note, he initially endorsed a plan to walk into traffic.  This was at approximately 2 AM when he arrived in the ED.  Patient was bedded in the ED at 7:30 AM.  On assessment, patient is sleeping.  He is easily arousable and is alert and oriented.  He currently denies any suicidal thoughts.  He does state that he does have depression and this limits him in making life decisions and getting out of his homeless situation.  He states that he does not recall the events of last night.  He denies any alcohol use.  Occasionally, he smokes marijuana.  He states that it is normal for him to forget some recent events.  Per chart review, this is consistent with recent ED visits and discussions between social work and his family.  When asked how we can help him today, patient simply states  that he would like to locate his car.  Given that he is amnestic to have he got here, he is unsure of where his car is located at this time.  Patient was given food and some time to think about where he might of left his car.  Nursing to contact EMS to try to obtain further information.  Per EMS, he was picked up at [facility].  I spoke with his daughter who states that he has been staying in shelters and may have lost his car prior to yesterday.  She has been working on getting him a dementia diagnosis but has not been able to get into his PCP appointment.  He does have an appointment scheduled for next week.  She states that she is unable to take him into her home at this time.  She did confirm that his memory loss is a chronic issue.  I get social work involved to help with disposition.  Social work had further conversations with daughter.  Plan will be for discharge back to [facility] where he can hopefully locate his car.  Daughter will likely take him into her home tomorrow.  Patient was discharged in stable condition.   Social Determinants of Health:  Homeless, undiagnosed dementia, frequent ED visits         Final Clinical Impression(s) / ED Diagnoses Final diagnoses:  Depression, unspecified depression type    Rx / DC Orders ED Discharge Orders     None         Gloris Manchester, MD 10/18/22 1348

## 2022-10-18 NOTE — Progress Notes (Signed)
CSW spoke with patients daughter Tanzania to let her know patients car was found at the [facility]. Patient was at the [facility] prior to EMS bringing him to the ED. Tanzania wants patient to be released back to the [facility] where his car is. Tanzania stated she does not have the money to get him a hotel today. Patient stated he only has two dollars right now. Tanzania stated he will get his SSI money next week. Tanzania wants the hospital to provide her father with her address [place], [place] [address] and to tell him to come to her house tomorrow and they can figure out living arrangements for him. CSW asked if patient can come today and she said no because her family has covid.

## 2022-10-18 NOTE — ED Provider Triage Note (Signed)
Emergency Medicine Provider Triage Evaluation Note  Glenn Barnett. , a 72 y.o. male  was evaluated in triage.  Pt complains of depression.  States he is currently not suicidal although he was earlier.  Denies any homicidal thoughts or any hallucinations.  This seems to be a chronic issue for him.  He states that it seems to get worse at night and better during the day.  Review of Systems  Positive: Depression Negative: Fever  Physical Exam  BP (!) 156/75   Pulse 67   Temp 98.7 F (37.1 C) (Oral)   Resp 16   Ht 6\' 4"  (1.93 m)   Wt 115.7 kg   SpO2 99%   BMI 31.04 kg/m  Gen:   Awake, no distress   Resp:  Normal effort  MSK:   Moves extremities without difficulty  Other:  Pleasant 73 year old male  Medical Decision Making  Medically screening exam initiated at 3:09 AM.  Appropriate orders placed.  Glenn Barnett. was informed that the remainder of the evaluation will be completed by another provider, this initial triage assessment does not replace that evaluation, and the importance of remaining in the ED until their evaluation is complete.  I discussed with Glenn Barnett of behavioral health urgent care overnight coverage.  They recommend TTS consultation. I did ask specifically if he would be able to a send patient by a safe transport to their facility for evaluation.  He states to start with TTS consultation and next dose will be figured out after this.   Glenn Barnett, [place] 10/18/22 [phone]

## 2022-10-18 NOTE — ED Triage Notes (Signed)
Pt brought bib [facility] from [facility] for suicidal thoughts with a plan to walk in front of traffic. Hx of depression. Denies HI. Vss w/ems

## 2022-10-18 NOTE — Progress Notes (Signed)
CSW spoke to patients daughter Toma Copier, [phone] about housing options. Tanzania stated that she contacted DHHS to see if she can deem patient incompetent. Tanzania stated she does not want to be the legal guardian but doesn't feel patient can make decisions for himself. CSW asked Tanzania besides shelters and the street if she has thought about assisted living. Tanzania told CSW that patient only makes $2200 a month. CSW told Tanzania she can call [facility] assisted living to see how much they are monthly. CSW provided Tanzania with the number. CSW also encouraged her to complete a medicaid application to see if he will qualify. CSW told Tanzania she wasn't sure since he makes over $2000 a month if he would qualify. CSW also recommended patient go to a hotel. Tanzania stated she isn't sure if her father has any money left this month but doesn't mind putting him in a hotel. Tanzania stated she has put him in multiple hotels before. CSW also informed Tanzania of his PCP appointment with internal medicine on 10/24/22.

## 2022-10-19 ENCOUNTER — Ambulatory Visit (HOSPITAL_COMMUNITY)
Admission: EM | Admit: 2022-10-19 | Discharge: 2022-10-19 | Disposition: A | Discharge status: Home / Self Care | Payer: Medicare Other | Attending: Psychiatry | Admitting: Psychiatry

## 2022-10-19 DIAGNOSIS — Z91141 Patient's other noncompliance with medication regimen due to financial hardship: Secondary | ICD-10-CM | POA: Insufficient documentation

## 2022-10-19 DIAGNOSIS — Z9151 Personal history of suicidal behavior: Secondary | ICD-10-CM | POA: Diagnosis not present

## 2022-10-19 DIAGNOSIS — F329 Major depressive disorder, single episode, unspecified: Secondary | ICD-10-CM | POA: Diagnosis not present

## 2022-10-19 DIAGNOSIS — Z5900 Homelessness unspecified: Secondary | ICD-10-CM | POA: Diagnosis not present

## 2022-10-19 NOTE — ED Notes (Signed)
Patient A&O x 4, ambulatory. Patient discharged in no acute distress. Patient denied SI/HI, A/VH upon discharge. Patient verbalized understanding of all discharge instructions explained by staff, to include follow up blue appointments, and safety plan. Patient reported mood 10/10.  Pt belongings returned to patient from locker intact. Patient escorted to lobby via staff for transport to [facility]. Safety maintained.

## 2022-10-19 NOTE — Progress Notes (Signed)
   10/19/22 1527  [place] (Walk-ins at [facility] only)  How Did You Hear About [place]? Legal System  What Is the Reason for Your Visit/Call Today? Pt BIB [employer] reporting that pt is having problems with his memory and wandering around. Pt reports he is homeless wandering around and ended up at a coffee shop and told the worker that he did not know where he was at, so they called [employer] and brought him here. Pt was discharged from the ED yesterday with plans to get his car and go to his daughter house. Pt reports he is confused and cannot remember where his car is or where his keys are. Pt remembers that he stayed in the shelter last night.  How Long Has This Been Causing You Problems? 1 wk - 1 month  Have You Recently Had Any Thoughts About Hurting Yourself? No  How long ago did you have thoughts about hurting yourself? na  Are You Planning to Commit Suicide/Harm Yourself At This time? No  Have you Recently Had Thoughts About [place]? No  Are You Planning To Harm Someone At This Time? No  Are you currently experiencing any auditory, visual or other hallucinations? No  Have You Used Any Alcohol or Drugs in the Past 24 Hours? No  Do you have any current medical co-morbidities that require immediate attention? No  What Do You Feel Would Help You the Most Today? Treatment for Depression or other mood problem;Housing Assistance  If access to [facility] was not available, would you have sought care in the Emergency Department? No  Determination of Need Routine (7 days)  Options For Referral Outpatient Therapy;Medication Management;Other: Comment

## 2022-10-19 NOTE — ED Provider Notes (Signed)
Behavioral Health Urgent Care Medical Screening Exam  Patient Name: Glenn Barnett. MRN: [id number] Date of Evaluation: 10/19/22 Chief Complaint:   Diagnosis:  Final diagnoses:  Homelessness    History of Present illness: Glenn Barnett. is a 72 y.o. male.  Patient presents to [facility] behavioral health for walk-in assessment.  Patient is seated in assessment area, no apparent distress.  He is alert and oriented, pleasant cooperative during assessment.  He presents with euthymic mood, congruent affect.  Patient states "I am looking for services, I need help with finding housing."  Patient reports he has resided in homeless shelters and hotels for 2 years.  Typically receives a monthly check every third Wednesday however he is not able to secure housing and unable to afford hotel rooms for an entire month.  Glenn Barnett has considered placement in a group home or other facility however he reports he is unable to afford this as he has a car payment.  He is not willing to have his entire check dedicated to his housing at this time.  Patient mental health history includes major depressive disorder, suicidal ideation.  He is not linked with outpatient psychiatry currently.  He is often noncompliant with medications related to homelessness and "my memory gets foggy."  He endorses history of multiple previous inpatient psychiatric hospitalizations.  No family mental health history reported.  Patient denies suicidal and homicidal ideations.  He easily contracts verbally for safety with this Probation officer.  He denies history of suicide attempts, denies history of nonsuicidal self-harm behavior.  He denies auditory and visual hallucinations.  There is no evidence of delusional thought content and no indication that patient is responding to internal stimuli.  He denies symptoms of paranoia.  Patient most recently resided at [facility].  He receives retirement income.  He endorses average sleep and  appetite.  He endorses marijuana use, less than 2 times per month.  Most recent marijuana use 2 weeks ago.  He denies alcohol use, denies substance use aside from marijuana.  Patient offered support and encouragement.  He gives verbal consent to speak with his daughter, Toma Copier phone number [phone].  Patient reports he would like to reside with his daughter and he is capable of driving his car to her home.  His car is at the [facility] however it is currently out of gas.  Patient would like for his daughter to pick him up at [facility] behavioral health today if possible.  Spoke with patient's daughter, Glenn Barnett, who reports "this has been going on for more than 2 years, I do not have space, I have 5 kids and my mother is here and I cannot afford to keep putting him into hotels."  Glenn Barnett shares that she has filed Adult Scientist, forensic cases with Glenn Barnett, [facility] during the past 18 months.  She filed Adult Scientist, forensic report with [facility] on yesterday, 10/18/2022.  Glenn Barnett is willing to pick up her father tomorrow and drive to [place] to apply for benefits.  She would also encourage her father to apply for Medicaid.  Glenn Barnett verbalizes plan to pick up her father and bring him to his primary care provider appointment scheduled for 10/24/2022 at 3:45 PM.   Patient and family are educated and verbalize understanding of mental health resources and other crisis services in the community. They are instructed to call 911 and present to the nearest emergency room should patient experience any suicidal/homicidal ideation,  auditory/visual/hallucinations, or detrimental worsening of mental health condition.     Flowsheet Row ED from 10/18/2022 in [facility] EMERGENCY DEPARTMENT ED from 10/17/2022 in [facility] DEPT ED from 10/11/2022 in [facility]  EMERGENCY DEPARTMENT  C-SSRS RISK CATEGORY High Risk High Risk Error: Question 6 not populated       Psychiatric Specialty Exam  Presentation  General Appearance:Appropriate for Environment; Casual  Eye Contact:Good  Speech:Clear and Coherent; Normal Rate  Speech Volume:Normal  Handedness:Right   Mood and Affect  Mood: Euthymic  Affect: Appropriate; Congruent   Thought Process  Thought Processes: Coherent; Goal Directed; Linear  Descriptions of Associations:Intact  Orientation:Full (Time, Place and Person)  Thought Content:Logical; WDL    Hallucinations:None  Ideas of Reference:None  Suicidal Thoughts:No With Intent; With Plan; With Means to Carry Out; With Access to Means With Plan  Homicidal Thoughts:No   Sensorium  Memory: Immediate Fair; Recent Fair  Judgment: Fair  Insight: Fair   Executive Functions  Concentration: Good  Attention Span: Good  Recall: Good  Fund of Knowledge: Good  Language: Good   Psychomotor Activity  Psychomotor Activity: Normal   Assets  Assets: Communication Skills; Desire for Improvement; Physical Health; Social Support; Resilience   Sleep  Sleep: Good  Number of hours:  6   No data recorded  Physical Exam: Physical Exam Vitals and nursing note reviewed.  Constitutional:      Appearance: Normal appearance. He is well-developed.  HENT:     Head: Normocephalic and atraumatic.  Cardiovascular:     Rate and Rhythm: Normal rate.  Pulmonary:     Effort: Pulmonary effort is normal.  Musculoskeletal:        General: Normal range of motion.  Skin:    General: Skin is warm and dry.  Neurological:     Mental Status: He is alert and oriented to person, place, and time.  Psychiatric:        Attention and Perception: Attention and perception normal.        Mood and Affect: Mood and affect normal.        Speech: Speech normal.        Behavior: Behavior normal. Behavior is cooperative.         Thought Content: Thought content normal.        Cognition and Memory: Cognition and memory normal.    Review of Systems  Constitutional: Negative.   HENT: Negative.    Eyes: Negative.   Respiratory: Negative.    Cardiovascular: Negative.   Gastrointestinal: Negative.   Genitourinary: Negative.   Musculoskeletal: Negative.   Skin: Negative.   Neurological: Negative.   Psychiatric/Behavioral: Negative.     Blood pressure (!) 179/104, pulse 65, temperature 98.1 F (36.7 C), temperature source Oral, resp. rate 18, SpO2 100 %. There is no height or weight on file to calculate BMI.  Musculoskeletal: Strength & Muscle Tone: within normal limits Gait & Station: normal Patient leans: N/A   [facility] MSE Discharge Disposition for Follow up and Recommendations: Based on my evaluation the patient does not appear to have an emergency medical condition and can be discharged with resources and follow up care in outpatient services for Individual Therapy Follow-up with primary care provider appointment on 10/24/2022 at 3:45 PM. Follow-up with outpatient psychiatry, resources provided. Continue current medications. Patient excepted to [employer], he will be transported there by safe transport team.  Patient verbalizes understanding and agreement with current plan.  He states "  I am ready to go to [facility], I really need a safe place to stay."   Lucky Rathke, FNP 10/19/2022, 4:51 PM

## 2022-10-19 NOTE — Discharge Instructions (Addendum)
Writer spoke to Clinical biochemist at the [employer].  The patient spoke to the screening coordinator on the phone and he was approved to come to the [facility] and participate in their program.   Probation officer informed the NP Beatriz Stallion and the RN working wit the patient.  The RN will assist with coordinating transportation.   [place] Coordinator     call to reschedule.  Be sure to follow-up with resources and follow-up appointments provided.  Patient has been instructed & cautione  Patient is instructed prior to discharge to:  Take all medications as prescribed by his/her mental healthcare provider. Report any adverse effects and or reactions from the medicines to his/her outpatient provider promptly. Keep all scheduled appointments, to ensure that you are getting refills on time and to avoid any interruption in your medication.  If you are unable to keep an appointment call to reschedule.  Be sure to follow-up with resources and follow-up appointments provided.  Patient has been instructed & cautioned: To not engage in alcohol and or illegal drug use while on prescription medicines. In the event of worsening symptoms, patient is instructed to call the crisis hotline, 911 and or go to the nearest ED for appropriate evaluation and treatment of symptoms. To follow-up with his/her primary care provider for your other medical issues, concerns and or health care needs.  Information: -National Suicide Prevention Lifeline 1-800-SUICIDE or 562-727-8805.  -988 offers 24/7 access to trained crisis counselors who can help people experiencing mental health-related distress. People can call or text 988 or chat 988lifeline.org for themselves or if they are worried about a loved one who may need crisis support.

## 2022-10-24 ENCOUNTER — Encounter: Payer: Medicare Other | Admitting: Internal Medicine

## 2022-10-24 NOTE — Progress Notes (Incomplete)
   CC: ***  HPI:Mr.Glenn Barnett. is a 72 y.o. male who presents for evaluation of ***. Please see individual problem based A/P for details.  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  Past Medical History:  Diagnosis Date  . Brain aneurysm   . Diabetes mellitus without complication ([place])    Review of Systems:   ROS   Physical Exam: There were no vitals filed for this visit.   General: *** HEENT: Conjunctiva nl , antiicteric sclerae, moist mucous membranes, no exudate or erythema Cardiovascular: Normal rate, regular rhythm.  No murmurs, rubs, or gallops Pulmonary : Equal breath sounds, No wheezes, rales, or rhonchi Abdominal: soft, nontender,  bowel sounds present Ext: No edema in lower extremities, no tenderness to palpation of lower extremities.   Assessment & Plan:   See Encounters Tab for problem based charting.  Patient {GC/GE:[id number]::"discussed with","seen with"} Dr. {XJOIT:[id number]::"E. Hoffman","Guilloud","Mullen","Narendra","Raines","Vincent","Williams"}

## 2023-07-05 IMAGING — CT CT HEAD W/O CM
4 series · 16 of 47 positions shown, 18 images · non-contrast
Comparison: March 20, 2021.

CLINICAL DATA: Dizziness.



[Series 2: head bone · axial · 0.56mm/px · z∈[-118,-84]mm · 3 of 87 slices shown]
[im 9/87  bone]
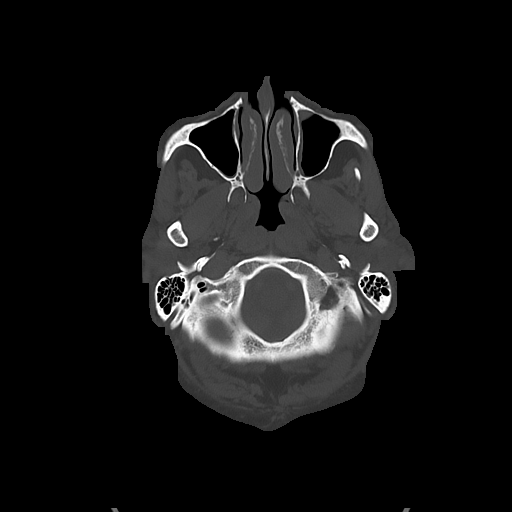
[im 18/87  bone]
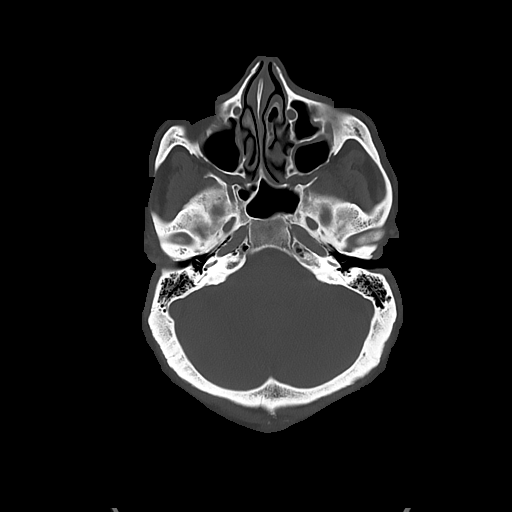
[im 26/87  bone]
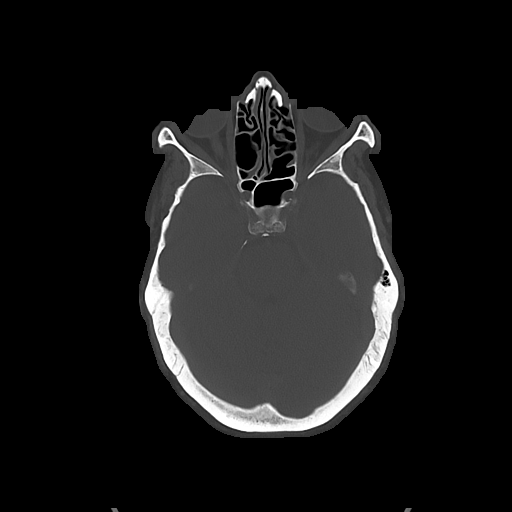

[Series 3: head (person_name) · axial · 0.56mm/px · z∈[-114,+10]mm · 7 of 35 slices shown, 9 images]
[im 5/35  brain]
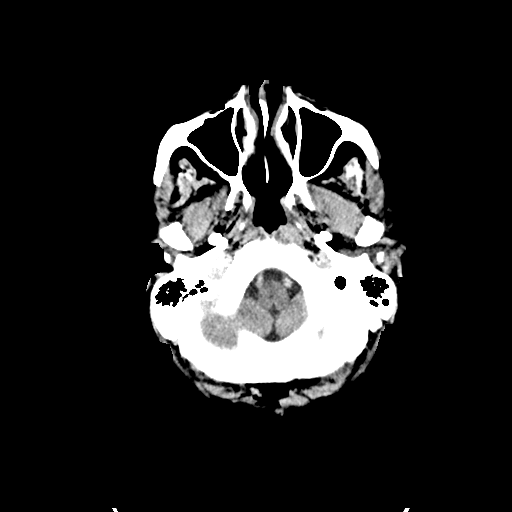
[im 5/35  bone]
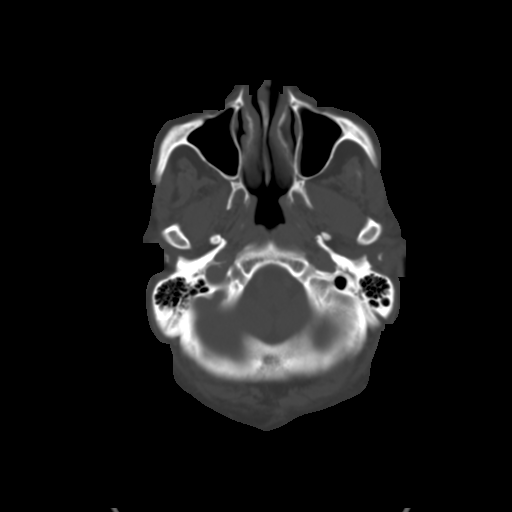
[im 9/35  brain]
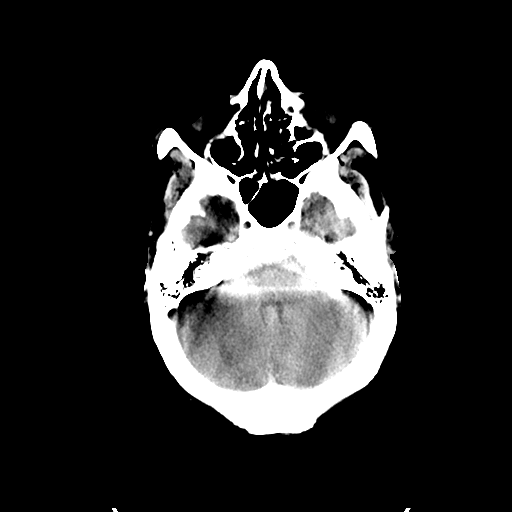
[im 13/35  brain]
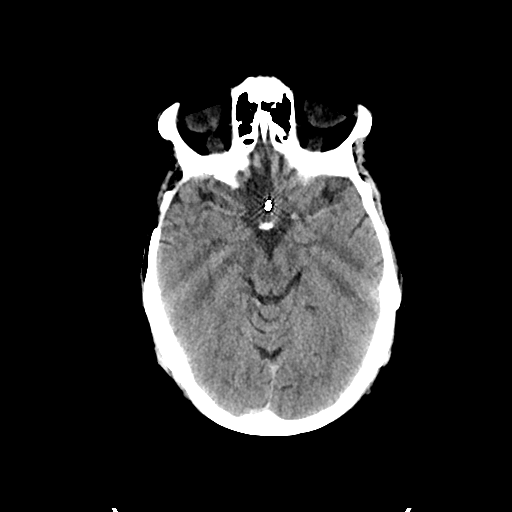
[im 18/35  brain]
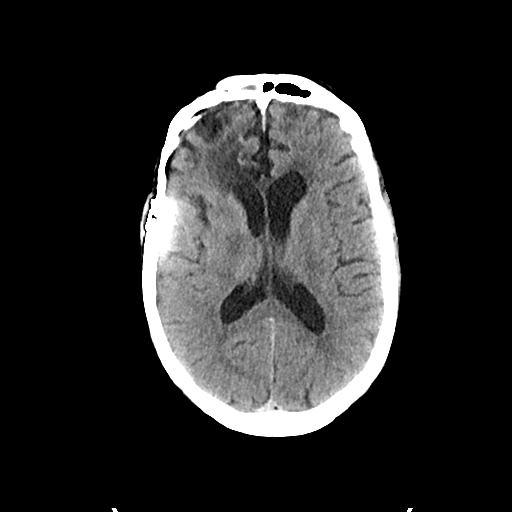
[im 22/35  brain]
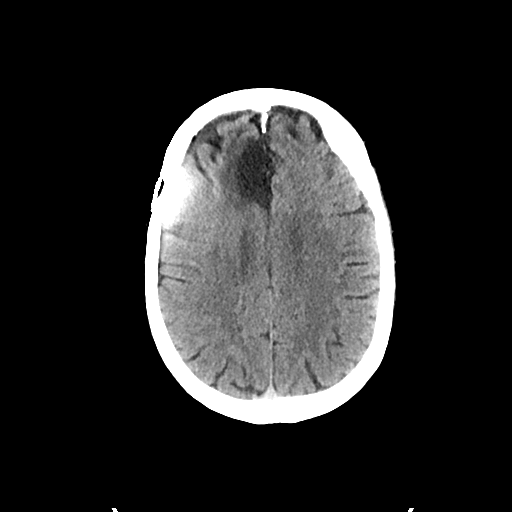
[im 22/35  bone]
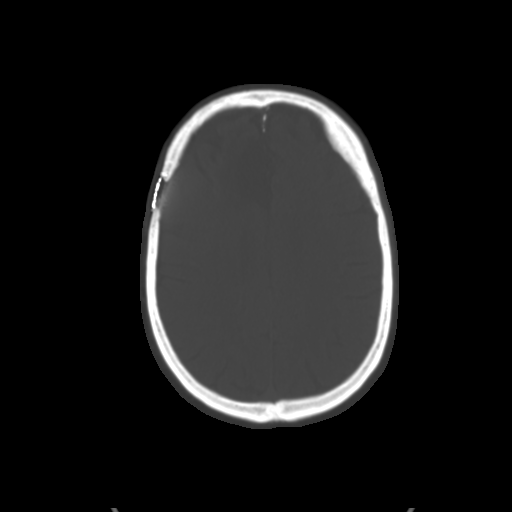
[im 26/35  brain]
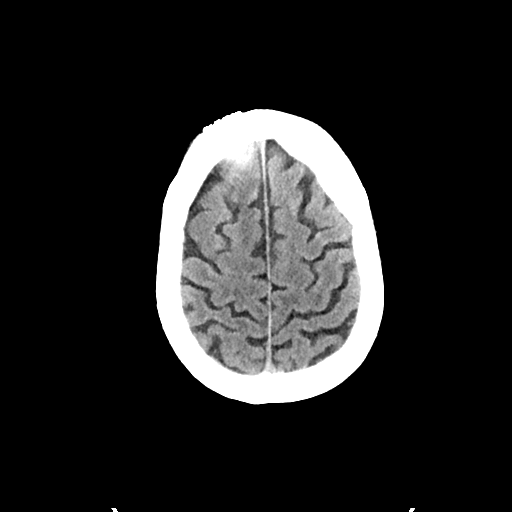
[im 30/35  brain]
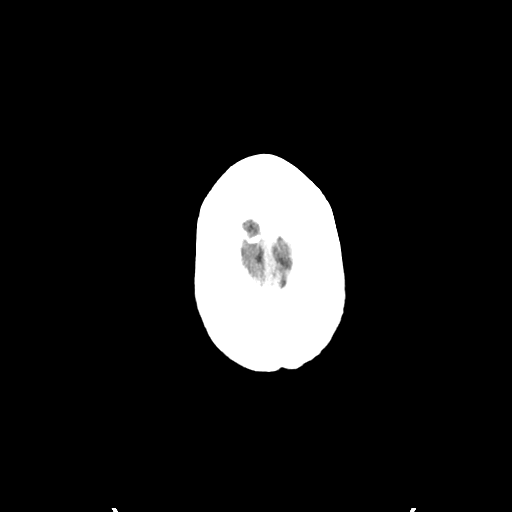

[Series 4: cor soft (person_name) · coronal · 0.34mm/px · 3 of 78 slices shown]
[im 29/78  brain]
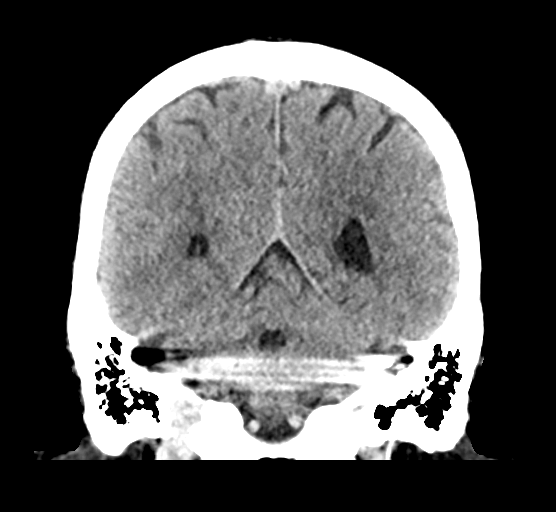
[im 36/78  brain]
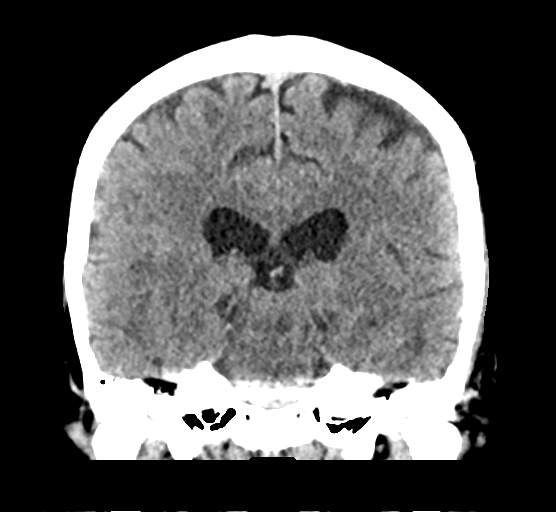
[im 42/78  brain]
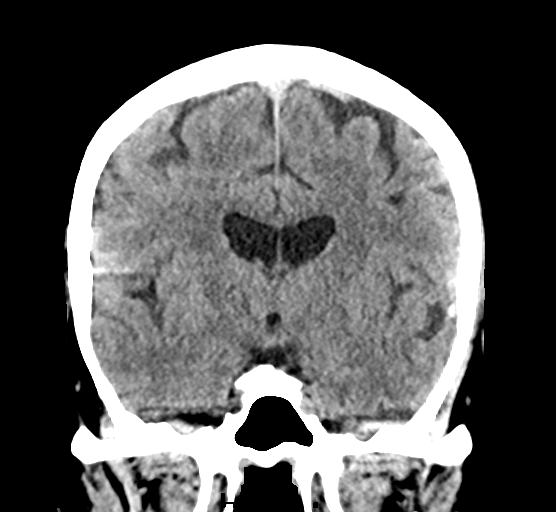

[Series 5: sag soft (person_name) · sagittal · 0.34mm/px · 3 of 64 slices shown]
[im 22/64  brain]
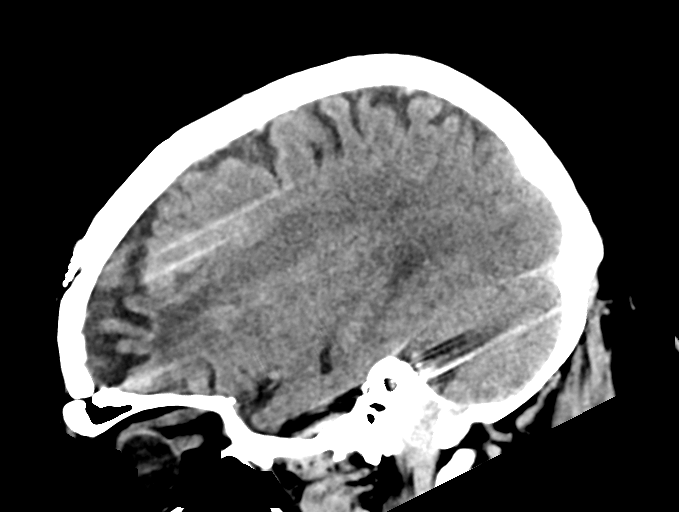
[im 32/64  brain]
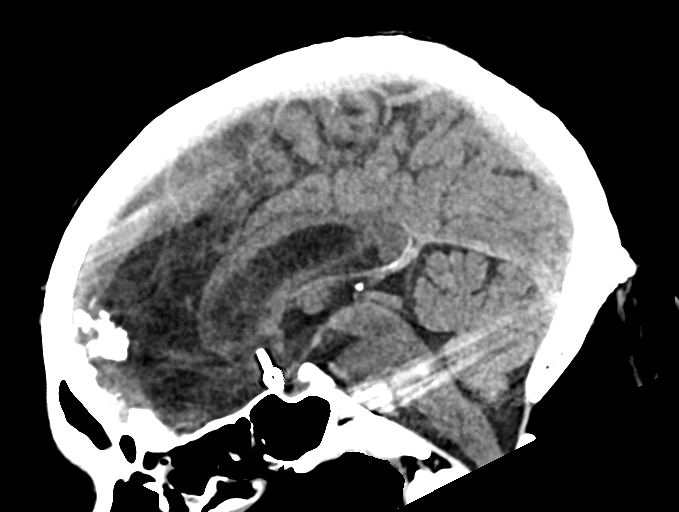
[im 43/64  brain]
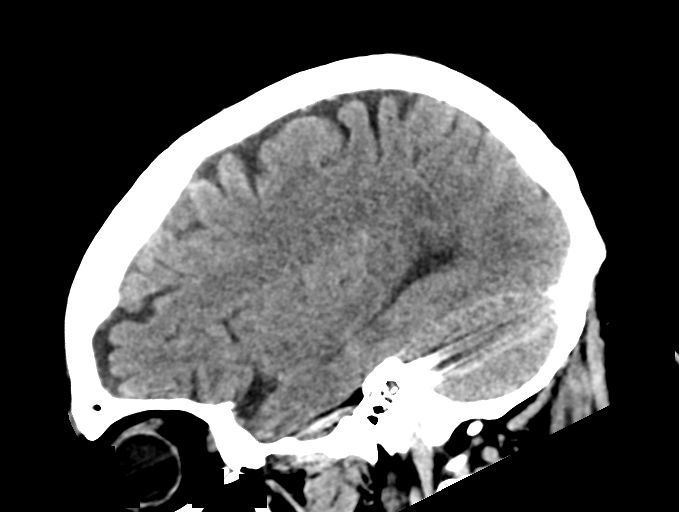

[16 of 47 positions shown; findings below may reference images not displayed]

FINDINGS: Brain: Right frontal encephalomalacia is again noted consistent with
history of prior aneurysm clipping. No mass effect or midline shift
is noted. Ventricular size is within normal limits. There is no
evidence of mass lesion, hemorrhage or acute infarction.

Vascular: As noted above, status post aneurysm clipping in region of
anterior communicating artery.

Skull: Status post right frontal craniotomy. No acute abnormality is
noted in the skull.

Sinuses/Orbits: No acute finding.

Other: None.
IMPRESSION: Stable right frontal encephalomalacia consistent with prior aneurysm
clipping. No acute intracranial abnormality seen.
